# Patient Record
Sex: Female | Born: 1980 | Race: Black or African American | Hispanic: No | Marital: Single | Smoking: Current every day smoker
Health system: Southern US, Community
[De-identification: ages and names within clinical notes are randomized; demographics above are authoritative.]

## PROBLEM LIST (undated history)

## (undated) DIAGNOSIS — J029 Acute pharyngitis, unspecified: Secondary | ICD-10-CM

## (undated) DIAGNOSIS — R51 Headache: Secondary | ICD-10-CM

## (undated) DIAGNOSIS — R002 Palpitations: Secondary | ICD-10-CM

## (undated) DIAGNOSIS — Z789 Other specified health status: Secondary | ICD-10-CM

## (undated) DIAGNOSIS — R1310 Dysphagia, unspecified: Secondary | ICD-10-CM

## (undated) DIAGNOSIS — R6883 Chills (without fever): Secondary | ICD-10-CM

## (undated) DIAGNOSIS — Z0282 Encounter for adoption services: Secondary | ICD-10-CM

## (undated) DIAGNOSIS — E079 Disorder of thyroid, unspecified: Secondary | ICD-10-CM

## (undated) DIAGNOSIS — E039 Hypothyroidism, unspecified: Secondary | ICD-10-CM

## (undated) DIAGNOSIS — R531 Weakness: Secondary | ICD-10-CM

## (undated) HISTORY — DX: Weakness: R53.1

## (undated) HISTORY — DX: Acute pharyngitis, unspecified: J02.9

## (undated) HISTORY — DX: Dysphagia, unspecified: R13.10

## (undated) HISTORY — DX: Headache: R51

## (undated) HISTORY — DX: Disorder of thyroid, unspecified: E07.9

## (undated) HISTORY — DX: Chills (without fever): R68.83

---

## 2004-08-22 ENCOUNTER — Other Ambulatory Visit: Admission: RE | Admit: 2004-08-22 | Discharge: 2004-08-22 | Payer: Self-pay | Admitting: Internal Medicine

## 2004-08-22 ENCOUNTER — Ambulatory Visit: Payer: Self-pay | Admitting: Internal Medicine

## 2004-08-31 ENCOUNTER — Ambulatory Visit: Payer: Self-pay | Admitting: Internal Medicine

## 2004-09-02 ENCOUNTER — Ambulatory Visit: Payer: Self-pay | Admitting: Internal Medicine

## 2005-04-04 ENCOUNTER — Ambulatory Visit: Payer: Self-pay | Admitting: Internal Medicine

## 2005-04-19 ENCOUNTER — Ambulatory Visit: Payer: Self-pay | Admitting: Family Medicine

## 2006-04-25 ENCOUNTER — Emergency Department (HOSPITAL_COMMUNITY): Admission: EM | Admit: 2006-04-25 | Discharge: 2006-04-25 | Payer: Self-pay | Admitting: Family Medicine

## 2006-05-19 ENCOUNTER — Inpatient Hospital Stay (HOSPITAL_COMMUNITY): Admission: AD | Admit: 2006-05-19 | Discharge: 2006-05-19 | Payer: Self-pay | Admitting: Gynecology

## 2007-06-10 ENCOUNTER — Inpatient Hospital Stay (HOSPITAL_COMMUNITY): Admission: AD | Admit: 2007-06-10 | Discharge: 2007-06-11 | Payer: Self-pay | Admitting: Obstetrics & Gynecology

## 2007-06-10 ENCOUNTER — Ambulatory Visit: Payer: Self-pay | Admitting: Obstetrics and Gynecology

## 2007-10-18 ENCOUNTER — Encounter: Payer: Self-pay | Admitting: Internal Medicine

## 2007-10-30 ENCOUNTER — Ambulatory Visit: Payer: Self-pay | Admitting: Internal Medicine

## 2007-10-30 DIAGNOSIS — M549 Dorsalgia, unspecified: Secondary | ICD-10-CM | POA: Insufficient documentation

## 2007-10-30 DIAGNOSIS — E039 Hypothyroidism, unspecified: Secondary | ICD-10-CM | POA: Insufficient documentation

## 2007-11-19 ENCOUNTER — Encounter: Payer: Self-pay | Admitting: Internal Medicine

## 2007-12-05 ENCOUNTER — Telehealth: Payer: Self-pay | Admitting: *Deleted

## 2008-01-03 ENCOUNTER — Ambulatory Visit: Payer: Self-pay | Admitting: Internal Medicine

## 2008-01-08 LAB — CONVERTED CEMR LAB: TSH: 9.73 microintl units/mL — ABNORMAL HIGH (ref 0.35–5.50)

## 2008-01-13 ENCOUNTER — Ambulatory Visit: Payer: Self-pay | Admitting: Internal Medicine

## 2008-01-13 DIAGNOSIS — H60399 Other infective otitis externa, unspecified ear: Secondary | ICD-10-CM | POA: Insufficient documentation

## 2008-01-13 DIAGNOSIS — R5383 Other fatigue: Secondary | ICD-10-CM

## 2008-01-13 DIAGNOSIS — R5381 Other malaise: Secondary | ICD-10-CM | POA: Insufficient documentation

## 2008-01-13 LAB — CONVERTED CEMR LAB: Glucose, Bld: 109 mg/dL

## 2008-02-18 ENCOUNTER — Encounter: Payer: Self-pay | Admitting: Internal Medicine

## 2009-08-16 ENCOUNTER — Inpatient Hospital Stay (HOSPITAL_COMMUNITY): Admission: AD | Admit: 2009-08-16 | Discharge: 2009-08-16 | Payer: Self-pay | Admitting: Obstetrics and Gynecology

## 2009-08-18 ENCOUNTER — Inpatient Hospital Stay (HOSPITAL_COMMUNITY): Admission: AD | Admit: 2009-08-18 | Discharge: 2009-08-18 | Payer: Self-pay | Admitting: Obstetrics & Gynecology

## 2009-09-15 ENCOUNTER — Emergency Department (HOSPITAL_COMMUNITY): Admission: EM | Admit: 2009-09-15 | Discharge: 2009-09-15 | Payer: Self-pay | Admitting: Emergency Medicine

## 2010-03-16 ENCOUNTER — Emergency Department (HOSPITAL_COMMUNITY): Admission: EM | Admit: 2010-03-16 | Discharge: 2010-03-16 | Payer: Self-pay | Admitting: Emergency Medicine

## 2010-03-23 ENCOUNTER — Emergency Department (HOSPITAL_COMMUNITY): Admission: EM | Admit: 2010-03-23 | Discharge: 2010-03-23 | Payer: Self-pay | Admitting: Family Medicine

## 2010-10-07 LAB — CBC
HCT: 39.8 % (ref 36.0–46.0)
MCHC: 32.4 g/dL (ref 30.0–36.0)
MCV: 87.1 fL (ref 78.0–100.0)
RDW: 13.2 % (ref 11.5–15.5)

## 2010-10-07 LAB — POCT PREGNANCY, URINE: Preg Test, Ur: NEGATIVE

## 2010-10-07 LAB — POCT I-STAT, CHEM 8
BUN: 7 mg/dL (ref 6–23)
Calcium, Ion: 1.18 mmol/L (ref 1.12–1.32)
Glucose, Bld: 90 mg/dL (ref 70–99)
TCO2: 25 mmol/L (ref 0–100)

## 2010-10-07 LAB — URINALYSIS, ROUTINE W REFLEX MICROSCOPIC
Leukocytes, UA: NEGATIVE
Protein, ur: NEGATIVE mg/dL
Urobilinogen, UA: 2 mg/dL — ABNORMAL HIGH (ref 0.0–1.0)

## 2010-10-07 LAB — URINE MICROSCOPIC-ADD ON

## 2010-10-07 LAB — DIFFERENTIAL
Basophils Absolute: 0 10*3/uL (ref 0.0–0.1)
Basophils Relative: 0 % (ref 0–1)
Eosinophils Relative: 3 % (ref 0–5)
Lymphocytes Relative: 34 % (ref 12–46)
Monocytes Absolute: 0.4 10*3/uL (ref 0.1–1.0)

## 2010-10-09 LAB — URINALYSIS, ROUTINE W REFLEX MICROSCOPIC
Bilirubin Urine: NEGATIVE
Ketones, ur: NEGATIVE mg/dL
Nitrite: NEGATIVE
pH: 5 (ref 5.0–8.0)

## 2010-10-09 LAB — WET PREP, GENITAL
Trich, Wet Prep: NONE SEEN
Yeast Wet Prep HPF POC: NONE SEEN

## 2010-10-09 LAB — GC/CHLAMYDIA PROBE AMP, GENITAL
Chlamydia, DNA Probe: NEGATIVE
GC Probe Amp, Genital: NEGATIVE

## 2010-10-09 LAB — HCG, QUANTITATIVE, PREGNANCY: hCG, Beta Chain, Quant, S: 59 m[IU]/mL — ABNORMAL HIGH (ref <5)

## 2010-10-09 LAB — CBC
HCT: 37.1 % (ref 36.0–46.0)
MCHC: 33.2 g/dL (ref 30.0–36.0)
MCV: 88.6 fL (ref 78.0–100.0)
Platelets: 283 10*3/uL (ref 150–400)
RDW: 13.8 % (ref 11.5–15.5)

## 2010-12-12 ENCOUNTER — Emergency Department (HOSPITAL_COMMUNITY)
Admission: EM | Admit: 2010-12-12 | Discharge: 2010-12-12 | Discharge status: Against Medical Advice | Payer: Medicaid Other | Attending: Emergency Medicine | Admitting: Emergency Medicine

## 2010-12-12 DIAGNOSIS — R112 Nausea with vomiting, unspecified: Secondary | ICD-10-CM | POA: Insufficient documentation

## 2010-12-12 LAB — DIFFERENTIAL
Lymphocytes Relative: 29 % (ref 12–46)
Monocytes Absolute: 0.4 10*3/uL (ref 0.1–1.0)
Monocytes Relative: 6 % (ref 3–12)
Neutro Abs: 3.7 10*3/uL (ref 1.7–7.7)

## 2010-12-12 LAB — URINALYSIS, ROUTINE W REFLEX MICROSCOPIC
Bilirubin Urine: NEGATIVE
Glucose, UA: NEGATIVE mg/dL
Ketones, ur: 15 mg/dL — AB
Specific Gravity, Urine: 1.018 (ref 1.005–1.030)
pH: 5.5 (ref 5.0–8.0)

## 2010-12-12 LAB — CBC
HCT: 37.9 % (ref 36.0–46.0)
Hemoglobin: 12.6 g/dL (ref 12.0–15.0)
MCH: 27.8 pg (ref 26.0–34.0)
MCHC: 33.2 g/dL (ref 30.0–36.0)
MCV: 83.5 fL (ref 78.0–100.0)

## 2010-12-12 LAB — POCT I-STAT, CHEM 8
BUN: 8 mg/dL (ref 6–23)
Calcium, Ion: 1.22 mmol/L (ref 1.12–1.32)
Chloride: 104 mEq/L (ref 96–112)
Glucose, Bld: 102 mg/dL — ABNORMAL HIGH (ref 70–99)
HCT: 41 % (ref 36.0–46.0)
Potassium: 4 mEq/L (ref 3.5–5.1)

## 2011-01-04 ENCOUNTER — Inpatient Hospital Stay (HOSPITAL_COMMUNITY)
Admission: AD | Admit: 2011-01-04 | Discharge: 2011-01-04 | Disposition: A | Discharge status: Home / Self Care | Payer: Medicaid Other | Source: Ambulatory Visit | Attending: Family Medicine | Admitting: Family Medicine

## 2011-01-04 DIAGNOSIS — N949 Unspecified condition associated with female genital organs and menstrual cycle: Secondary | ICD-10-CM | POA: Insufficient documentation

## 2011-01-04 DIAGNOSIS — N938 Other specified abnormal uterine and vaginal bleeding: Secondary | ICD-10-CM | POA: Insufficient documentation

## 2011-01-04 LAB — URINE MICROSCOPIC-ADD ON

## 2011-01-04 LAB — URINALYSIS, ROUTINE W REFLEX MICROSCOPIC
Glucose, UA: NEGATIVE mg/dL
Specific Gravity, Urine: 1.015 (ref 1.005–1.030)
Urobilinogen, UA: 1 mg/dL (ref 0.0–1.0)

## 2011-01-04 LAB — CBC
MCH: 27.4 pg (ref 26.0–34.0)
MCV: 86.4 fL (ref 78.0–100.0)
Platelets: 206 10*3/uL (ref 150–400)
RDW: 13.4 % (ref 11.5–15.5)

## 2011-01-04 LAB — WET PREP, GENITAL: Clue Cells Wet Prep HPF POC: NONE SEEN

## 2011-05-02 LAB — URINALYSIS, ROUTINE W REFLEX MICROSCOPIC
Bilirubin Urine: NEGATIVE
Glucose, UA: NEGATIVE
Ketones, ur: NEGATIVE
Protein, ur: NEGATIVE

## 2011-05-09 ENCOUNTER — Other Ambulatory Visit: Payer: Self-pay | Admitting: Internal Medicine

## 2011-05-09 DIAGNOSIS — E039 Hypothyroidism, unspecified: Secondary | ICD-10-CM

## 2011-05-11 ENCOUNTER — Ambulatory Visit
Admission: RE | Admit: 2011-05-11 | Discharge: 2011-05-11 | Disposition: A | Discharge status: Home / Self Care | Payer: Medicaid Other | Source: Ambulatory Visit | Attending: Internal Medicine | Admitting: Internal Medicine

## 2011-05-11 DIAGNOSIS — E039 Hypothyroidism, unspecified: Secondary | ICD-10-CM

## 2011-07-25 DIAGNOSIS — E041 Nontoxic single thyroid nodule: Secondary | ICD-10-CM

## 2011-07-25 HISTORY — DX: Nontoxic single thyroid nodule: E04.1

## 2011-07-26 ENCOUNTER — Encounter (INDEPENDENT_AMBULATORY_CARE_PROVIDER_SITE_OTHER): Payer: Self-pay | Admitting: General Surgery

## 2011-07-27 ENCOUNTER — Encounter (INDEPENDENT_AMBULATORY_CARE_PROVIDER_SITE_OTHER): Payer: Self-pay | Admitting: Surgery

## 2011-07-27 ENCOUNTER — Ambulatory Visit (INDEPENDENT_AMBULATORY_CARE_PROVIDER_SITE_OTHER): Payer: Medicaid Other | Admitting: Surgery

## 2011-07-27 VITALS — BP 118/72 | HR 76 | Temp 98.1°F | Ht 66.0 in | Wt 171.6 lb

## 2011-07-27 DIAGNOSIS — E041 Nontoxic single thyroid nodule: Secondary | ICD-10-CM

## 2011-07-27 NOTE — Patient Instructions (Signed)
Thyroid Diseases Your thyroid is a butterfly-shaped gland in your neck. It is located just above your collarbone. It is one of your endocrine glands, which make hormones. The thyroid helps set your metabolism. Metabolism is how your body gets energy from the foods you eat.  Millions of people have thyroid diseases. Women experience thyroid problems more often than men. In fact, overactive thyroid problems (hyperthyroidism) occur in 1% of all women. If you have a thyroid disease, your body may use energy more slowly or quickly than it should.  Thyroid problems also include an immune disease where your body reacts against your thyroid gland (called thyroiditis). A different problem involves lumps and bumps (called nodules) that develop in the gland. The nodules are usually, but not always, noncancerous. THE MOST COMMON THYROID PROBLEMS AND CAUSES ARE DISCUSSED BELOW There are many causes for thyroid problems. Treatment depends upon the exact diagnosis and includes trying to reset your body's metabolism to a normal rate. Hyperthyroidism Too much thyroid hormone from an overactive thyroid gland is called hyperthyroidism. In hyperthyroidism, the body's metabolism speeds up. One of the most frequent forms of hyperthyroidism is known as Graves' disease. Graves' disease tends to run in families. Although Graves' is thought to be caused by a problem with the immune system, the exact nature of the genetic problem is unknown. Hypothyroidism Too little thyroid hormone from an underactive thyroid gland is called hypothyroidism. In hypothyroidism, the body's metabolism is slowed. Several things can cause this condition. Most causes affect the thyroid gland directly and hurt its ability to make enough hormone.  Rarely, there may be a pituitary gland tumor (located near the base of the brain). The tumor can block the pituitary from producing thyroid-stimulating hormone (TSH). Your body makes TSH to stimulate the thyroid  to work properly. If the pituitary does not make enough TSH, the thyroid fails to make enough hormones needed for good health. Whether the problem is caused by thyroid conditions or by the pituitary gland, the result is that the thyroid is not making enough hormones. Hypothyroidism causes many physical and mental processes to become sluggish. The body consumes less oxygen and produces less body heat. Thyroid Nodules A thyroid nodule is a small swelling or lump in the thyroid gland. They are common. These nodules represent either a growth of thyroid tissue or a fluid-filled cyst. Both form a lump in the thyroid gland. Almost half of all people will have tiny thyroid nodules at some point in their lives. Typically, these are not noticeable until they become large and affect normal thyroid size. Larger nodules that are greater than a half inch across (about 1 centimeter) occur in about 5 percent of people. Although most nodules are not cancerous, people who have them should seek medical care to rule out cancer. Also, some thyroid nodules may produce too much thyroid hormone or become too large. Large nodules or a large gland can interfere with breathing or swallowing or may cause neck discomfort. Other problems Other thyroid problems include cancer and thyroiditis. Thyroiditis is a malfunction of the body's immune system. Normally, the immune system works to defend the body against infection and other problems. When the immune system is not working properly, it may mistakenly attack normal cells, tissues, and organs. Examples of autoimmune diseases are Hashimoto's thyroiditis (which causes low thyroid function) and Graves' disease (which causes excess thyroid function). SYMPTOMS  Symptoms vary greatly depending upon the exact type of problem with the thyroid. Hyperthyroidism-is when your thyroid is too   active and makes more thyroid hormone than your body needs. The most common cause is Graves' Disease. Too  much thyroid hormone can cause some or all of the following symptoms:  Anxiety.   Irritability.   Difficulty sleeping.   Fatigue.   A rapid or irregular heartbeat.   A fine tremor of your hands or fingers.   An increase in perspiration.   Sensitivity to heat.   Weight loss, despite normal food intake.   Brittle hair.   Enlargement of your thyroid gland (goiter).   Light menstrual periods.   Frequent bowel movements.  Graves' disease can specifically cause eye and skin problems. The skin problems involve reddening and swelling of the skin, often on your shins and on the top of your feet. Eye problems can include the following:  Excess tearing and sensation of grit or sand in either or both eyes.   Reddened or inflamed eyes.   Widening of the space between your eyelids.   Swelling of the lids and tissues around the eyes.   Light sensitivity.   Ulcers on the cornea.   Double vision.   Limited eye movements.   Blurred or reduced vision.  Hypothyroidism- is when your thyroid gland is not active enough. This is more common than hyperthyroidism. Symptoms can vary a lot depending of the severity of the hormone deficiency. Symptoms may develop over a long period of time and can include several of the following:  Fatigue.   Sluggishness.   Increased sensitivity to cold.   Constipation.   Pale, dry skin.   A puffy face.   Hoarse voice.   High blood cholesterol level.   Unexplained weight gain.   Muscle aches, tenderness and stiffness.   Pain, stiffness or swelling in your joints.   Muscle weakness.   Heavier than normal menstrual periods.   Brittle fingernails and hair.   Depression.  Thyroid Nodules - most do not cause signs or symptoms. Occasionally, some may become so large that you can feel or even see the swelling at the base of your neck. You may realize a lump or swelling is there when you are shaving or putting on makeup. Men might become  aware of a nodule when shirt collars suddenly feel too tight. Some nodules produce too much thyroid hormone. This can produce the same symptoms as hyperthyroidism (see above). Thyroid nodules are seldom cancerous. However, a nodule is more likely to be malignant (cancerous) if it:  Grows quickly or feels hard.   Causes you to become hoarse or to have trouble swallowing or breathing.   Causes enlarged lymph nodes under your jaw or in your neck.  DIAGNOSIS  Because there are so many possible thyroid conditions, your caregiver may ask for a number of tests. They will do this in order to narrow down the exact diagnosis. These tests can include:  Blood and antibody tests.   Special thyroid scans using small, safe amounts of radioactive iodine.   Ultrasound of the thyroid gland (particularly if there is a nodule or lump).   Biopsy. This is usually done with a special needle. A needle biopsy is a procedure to obtain a sample of cells from the thyroid. The tissue will be tested in a lab and examined under a microscope.  TREATMENT  Treatment depends on the exact diagnosis. Hyperthyroidism  Beta-blockers help relieve many of the symptoms.   Anti-thyroid medications prevent the thyroid from making excess hormones.   Radioactive iodine treatment can destroy overactive thyroid   cells. The iodine can permanently decrease the amount of hormone produced.   Surgery to remove the thyroid gland.   Treatments for eye problems that come from Graves' disease also include medications and special eye surgery, if felt to be appropriate.  Hypothyroidism Thyroid replacement with levothyroxine is the mainstay of treatment. Treatment with thyroid replacement is usually lifelong and will require monitoring and adjustment from time to time. Thyroid Nodules  Watchful waiting. If a small nodule causes no symptoms or signs of cancer on biopsy, then no treatment may be chosen at first. Re-exam and re-checking blood  tests would be the recommended follow-up.   Anti-thyroid medications or radioactive iodine treatment may be recommended if the nodules produce too much thyroid hormone (see Treatment for Hyperthyroidism above).   Alcohol ablation. Injections of small amounts of ethyl alcohol (ethanol) can cause a non-cancerous nodule to shrink in size.   Surgery (see Treatment for Hyperthyroidism above).  HOME CARE INSTRUCTIONS   Take medications as instructed.   Follow through on recommended testing.  SEEK MEDICAL CARE IF:   You feel that you are developing symptoms of Hyperthyroidism or Hypothyroidism as described above.   You develop a new lump/nodule in the neck/thyroid area that you had not noticed before.   You feel that you are having side effects from medicines prescribed.   You develop trouble breathing or swallowing.  SEEK IMMEDIATE MEDICAL CARE IF:   You develop a fever of 102 F (38.9 C) or higher.   You develop severe sweating.   You develop palpitations and/or rapid heart beat.   You develop shortness of breath.   You develop nausea and vomiting.   You develop extreme shakiness.   You develop agitation.   You develop lightheadedness or have a fainting episode.  Document Released: 05/07/2007 Document Revised: 03/22/2011 Document Reviewed: 05/07/2007 [employer] Patient Information 2012 [employer]. 

## 2011-07-27 NOTE — Progress Notes (Signed)
Patient ID: Briana Harrell, female   DOB: July 05, 1981, 31 y.o.   MRN: [id number]  Chief Complaint  Patient presents with  . Pre-op Exam    eval thy nodule    HPI Briana Harrell is a 31 y.o. female.  The patient presents at the request of Dr. Roderic Palau for a thyroid nodule and dysphasia. She complains of some difficulty swallowing and her throat and some soreness along the left side of her neck over her thyroid. She has been hypothyroid for 10 years and takes Synthroid. She complains of some tenderness in her left neck. She denies any hoarseness and stopped smoking 2 years ago.HPI  Past Medical History  Diagnosis Date  . Thyroid disease   . Chills   . Sore throat   . Trouble swallowing   . Constipation   . Headache   . Weakness     History reviewed. No pertinent past surgical history.  History reviewed. No pertinent family history.  Social History History  Substance Use Topics  . Smoking status: Former Smoker    Quit date: 12/24/2009  . Smokeless tobacco: Not on file  . Alcohol Use: Yes     social    No Known Allergies  Current Outpatient Prescriptions  Medication Sig Dispense Refill  . ALPRAZolam (XANAX) 0.5 MG tablet Take 0.5 mg by mouth at bedtime as needed.        Marland Kitchen levothyroxine (SYNTHROID, LEVOTHROID) 200 MCG tablet Take 200 mcg by mouth daily.          Review of Systems Review of Systems  Constitutional: Positive for fatigue and unexpected weight change.  HENT: Positive for neck pain. Negative for neck stiffness.   Eyes: Negative.   Respiratory: Positive for choking.   Cardiovascular: Negative.   Gastrointestinal: Negative.   Genitourinary: Negative.   Neurological: Negative.   Hematological: Negative.   Psychiatric/Behavioral: Negative.     Blood pressure 118/72, pulse 76, temperature 98.1 F (36.7 C), temperature source Temporal, height 5\' 6"  (1.676 m), weight 171 lb 9.6 oz (77.837 kg), SpO2 98.00%.  Physical Exam Physical Exam  Constitutional: She  is oriented to person, place, and time. She appears well-developed and well-nourished.  HENT:  Head: Normocephalic and atraumatic.  Eyes: EOM are normal. Pupils are equal, round, and reactive to light.  Neck: Normal range of motion. Neck supple. No tracheal deviation present. No thyromegaly present.  Cardiovascular: Normal rate and regular rhythm.   Pulmonary/Chest: Effort normal and breath sounds normal. No stridor.  Musculoskeletal: Normal range of motion.  Lymphadenopathy:    She has no cervical adenopathy.  Neurological: She is alert and oriented to person, place, and time.  Skin: Skin is warm and dry.  Psychiatric: She has a normal mood and affect. Her behavior is normal. Judgment and thought content normal.    Data Reviewed Thyroid U/S ATROPHIC GLAND.  5 MM NODULE LEFT LOBE. tsh 0.040  T4 free  1.71  Assessment    5 MM LEFT THYROID LOBE NODULE HYPOTHYROIDISM    Plan    Too small to biopsy.  Recommend 6 month follow up.        Naomi Fitton A. 07/27/2011, 11:44 AM

## 2012-01-23 ENCOUNTER — Ambulatory Visit (INDEPENDENT_AMBULATORY_CARE_PROVIDER_SITE_OTHER): Payer: Medicaid Other | Admitting: Surgery

## 2012-01-30 ENCOUNTER — Encounter (INDEPENDENT_AMBULATORY_CARE_PROVIDER_SITE_OTHER): Payer: Self-pay | Admitting: Surgery

## 2013-05-13 IMAGING — US US SOFT TISSUE HEAD/NECK
1 series · 14 of 25 positions shown · non-contrast
Comparison: None.

CLINICAL DATA: Hypothyroidism difficulty swallowing.

ULTRASOUND OF HEAD/NECK SOFT TISSUES
TECHNIQUE: Ultrasound examination of the head and neck soft
tissues was performed in the area of clinical concern.

[Series 1: us soft tissue head/neck · 0.08mm/px · 14 of 31 slices shown]
[im 1/31]
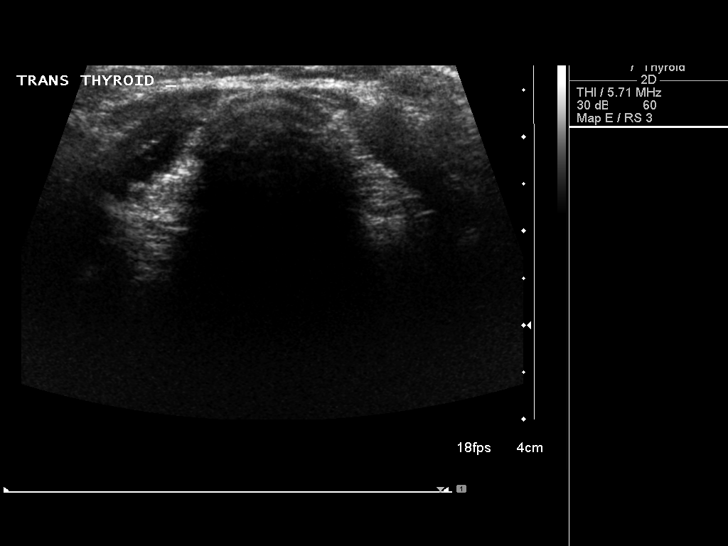
[im 3/31]
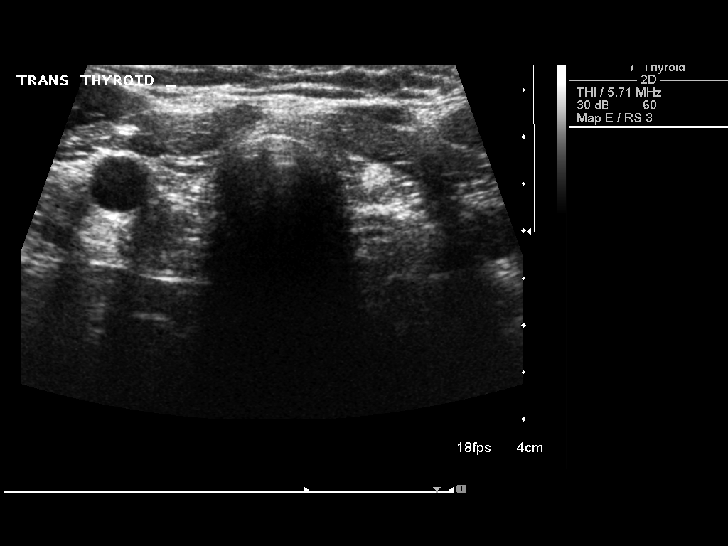
[im 6/31]
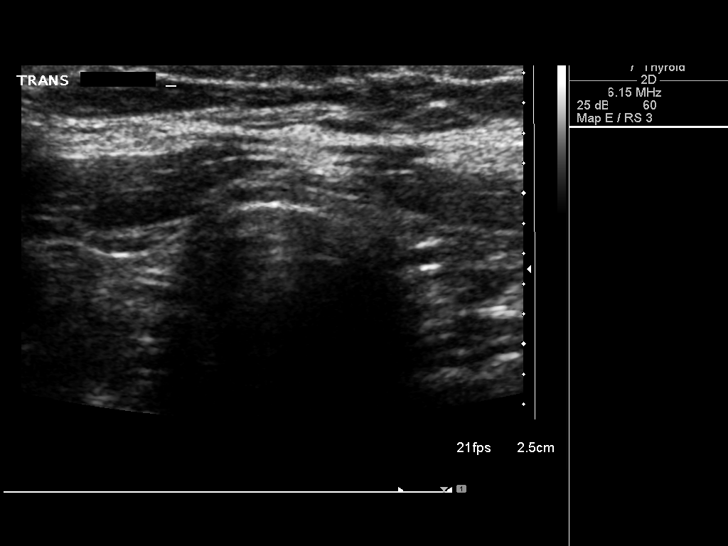
[im 8/31]
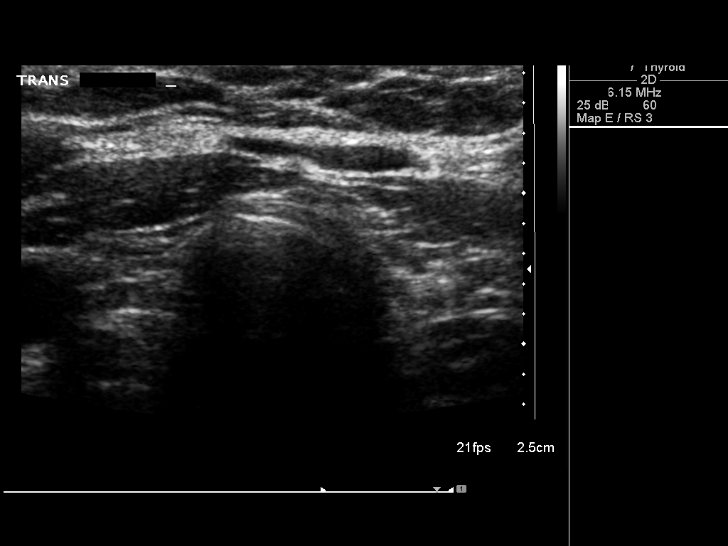
[im 11/31]
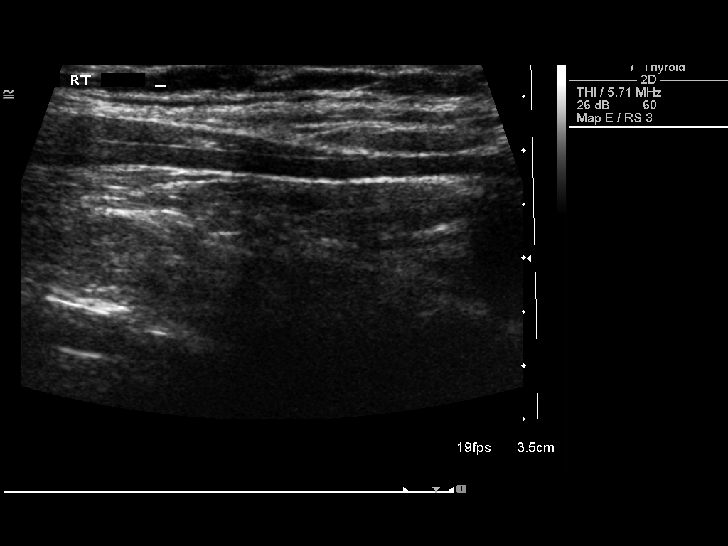
[im 12/31]
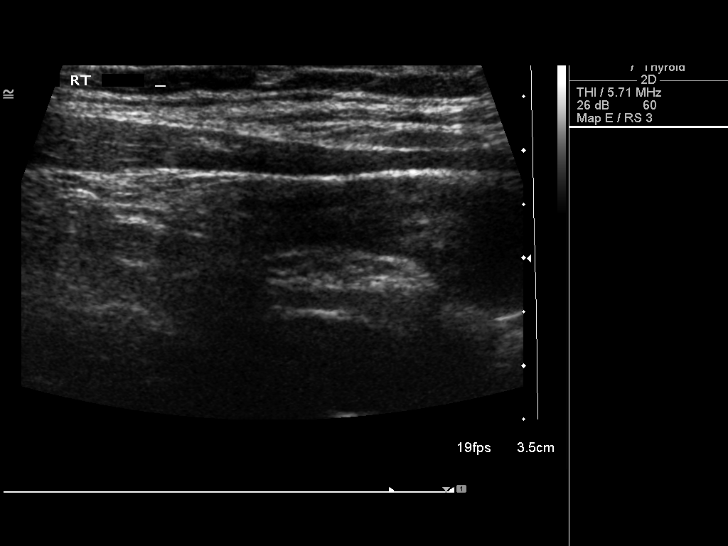
[im 14/31]
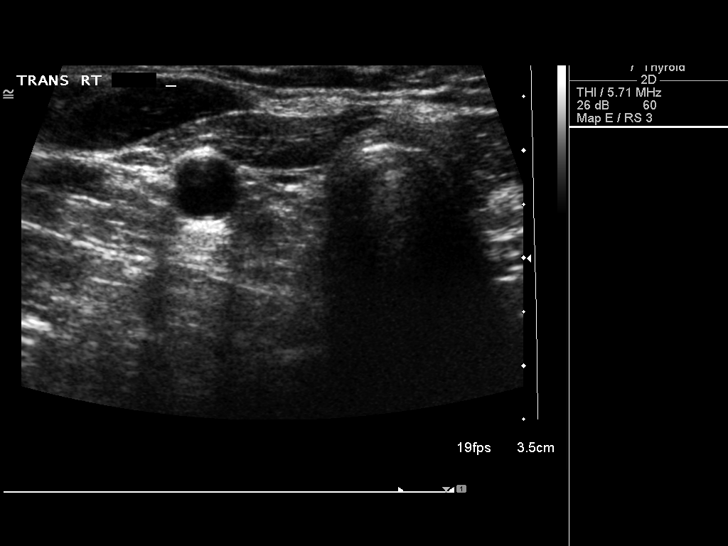
[im 17/31]
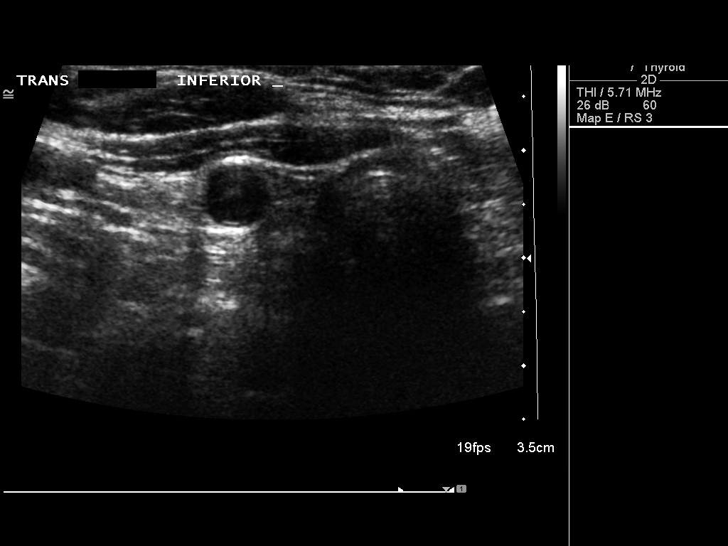
[im 19/31]
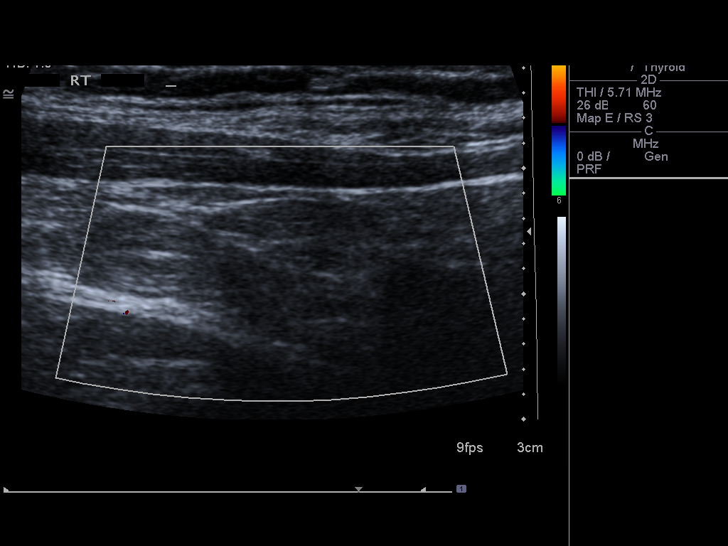
[im 21/31]
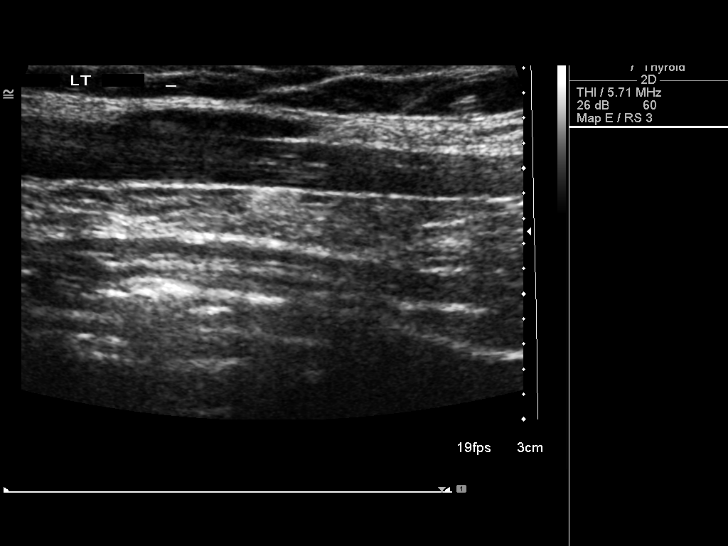
[im 23/31]
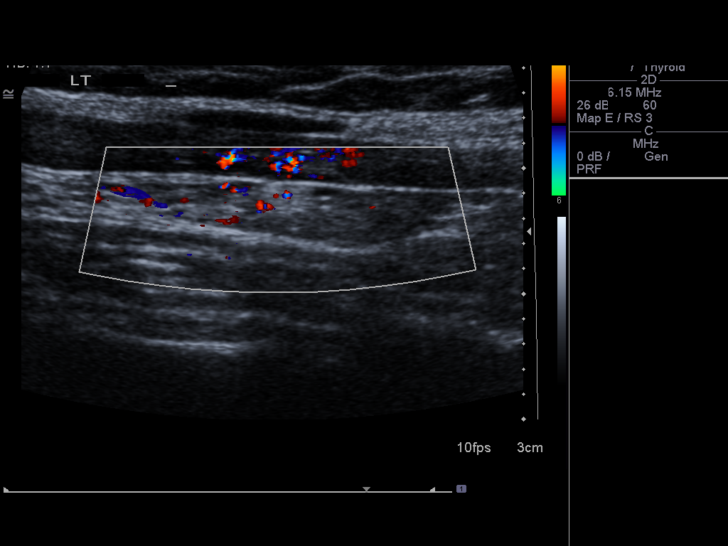
[im 26/31]
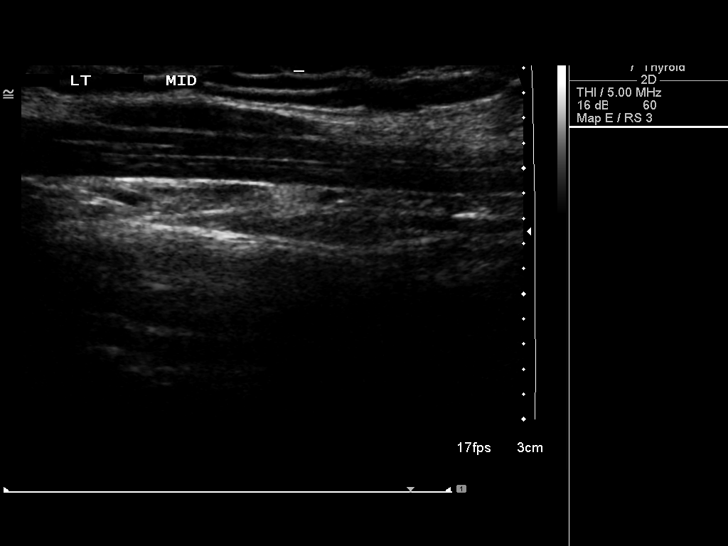
[im 28/31]
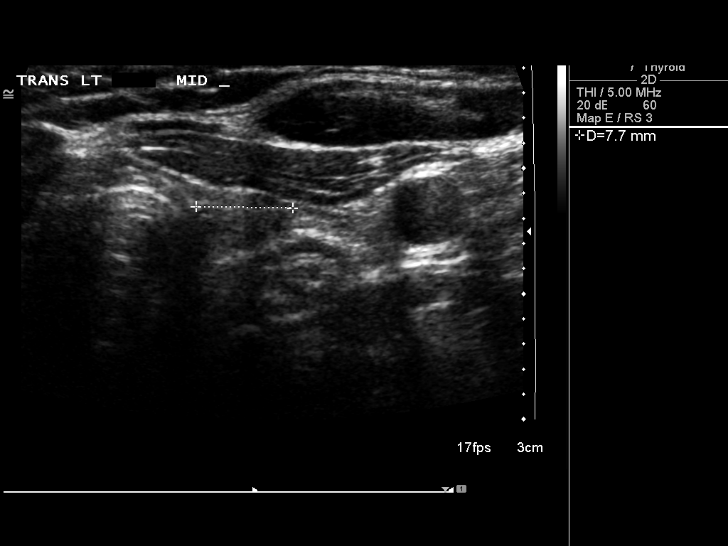
[im 31/31]
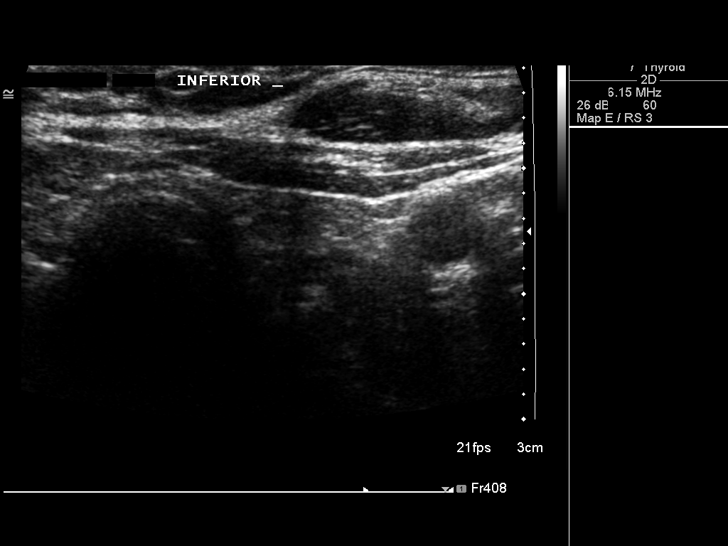

[14 of 25 positions shown; findings below may reference images not displayed]

FINDINGS: The right lobe measures 2.7 x 0.7 x 0.9 cm.

The left lobe measures 2.9 x 0.5 x 0.8 cm.

The isthmus measures 0.1 cm.

The thyroid echotexture is diffusely hypoechoic and heterogeneous
in appearance.

There is a solid echogenic nodule within the mid pole of the left
lobe which measures 5 x 3 x 4 mm. No microcalcifications
identified.  No cysts identified.
IMPRESSION: 1.  Hypoechoic and atrophic appearing thyroid gland.
2.  Solitary nodule in the left lobe of the thyroid gland measures
5 x 3 x 4 mm. There are no specific features of this nodule which
would indicate the necessity for biopsy.

## 2015-04-29 ENCOUNTER — Emergency Department (HOSPITAL_COMMUNITY)
Admission: EM | Admit: 2015-04-29 | Discharge: 2015-04-29 | Disposition: A | Discharge status: Other Acute Inpatient Hospital | Payer: Medicaid Other | Attending: Emergency Medicine | Admitting: Emergency Medicine

## 2015-04-29 ENCOUNTER — Encounter (HOSPITAL_COMMUNITY): Payer: Self-pay | Admitting: Emergency Medicine

## 2015-04-29 DIAGNOSIS — Z046 Encounter for general psychiatric examination, requested by authority: Secondary | ICD-10-CM | POA: Diagnosis present

## 2015-04-29 DIAGNOSIS — E079 Disorder of thyroid, unspecified: Secondary | ICD-10-CM | POA: Insufficient documentation

## 2015-04-29 DIAGNOSIS — Z87891 Personal history of nicotine dependence: Secondary | ICD-10-CM | POA: Insufficient documentation

## 2015-04-29 DIAGNOSIS — Z8719 Personal history of other diseases of the digestive system: Secondary | ICD-10-CM | POA: Insufficient documentation

## 2015-04-29 DIAGNOSIS — F1210 Cannabis abuse, uncomplicated: Secondary | ICD-10-CM | POA: Diagnosis not present

## 2015-04-29 DIAGNOSIS — Z79899 Other long term (current) drug therapy: Secondary | ICD-10-CM | POA: Diagnosis not present

## 2015-04-29 DIAGNOSIS — F1410 Cocaine abuse, uncomplicated: Secondary | ICD-10-CM | POA: Diagnosis not present

## 2015-04-29 DIAGNOSIS — F1510 Other stimulant abuse, uncomplicated: Secondary | ICD-10-CM | POA: Diagnosis not present

## 2015-04-29 DIAGNOSIS — Z008 Encounter for other general examination: Secondary | ICD-10-CM

## 2015-04-29 DIAGNOSIS — Z8709 Personal history of other diseases of the respiratory system: Secondary | ICD-10-CM | POA: Insufficient documentation

## 2015-04-29 LAB — CBC
HCT: 45.1 % (ref 36.0–46.0)
Hemoglobin: 15.1 g/dL — ABNORMAL HIGH (ref 12.0–15.0)
MCH: 29.3 pg (ref 26.0–34.0)
MCHC: 33.5 g/dL (ref 30.0–36.0)
MCV: 87.4 fL (ref 78.0–100.0)
PLATELETS: 416 10*3/uL — AB (ref 150–400)
RBC: 5.16 MIL/uL — ABNORMAL HIGH (ref 3.87–5.11)
RDW: 14.4 % (ref 11.5–15.5)
WBC: 14.5 10*3/uL — ABNORMAL HIGH (ref 4.0–10.5)

## 2015-04-29 LAB — BASIC METABOLIC PANEL
Anion gap: 13 (ref 5–15)
BUN: 7 mg/dL (ref 6–20)
CHLORIDE: 100 mmol/L — AB (ref 101–111)
CO2: 24 mmol/L (ref 22–32)
CREATININE: 0.81 mg/dL (ref 0.44–1.00)
Calcium: 9.6 mg/dL (ref 8.9–10.3)
GFR calc Af Amer: >60 mL/min (ref >60)
GLUCOSE: 111 mg/dL — AB (ref 65–99)
Potassium: 3.2 mmol/L — ABNORMAL LOW (ref 3.5–5.1)
SODIUM: 137 mmol/L (ref 135–145)

## 2015-04-29 LAB — ETHANOL

## 2015-04-29 LAB — I-STAT BETA HCG BLOOD, ED (MC, WL, AP ONLY): I-stat hCG, quantitative: <5 m[IU]/mL (ref <5)

## 2015-04-29 MED ORDER — POTASSIUM CHLORIDE CRYS ER 20 MEQ PO TBCR
20.0000 meq | EXTENDED_RELEASE_TABLET | Freq: Two times a day (BID) | ORAL | Status: DC
  Administered 2015-04-29: 20 meq via ORAL
  Filled 2015-04-29: qty 1

## 2015-04-29 NOTE — ED Provider Notes (Signed)
CSN: [id number]     Arrival date & time 04/29/15  8657 History   First MD Initiated Contact with Patient 04/29/15 [phone]     Chief Complaint  Patient presents with  . Medical Clearance     (Consider location/radiation/quality/duration/timing/severity/associated sxs/prior Treatment) HPI Comments: 34 y.o. Female presents with police from [facility] for medical clearance.  The patient was there for delusions and is IVC'd the patient reportedly tested positive for cocaine, amphetamines, and marijuana and so was sent here for medical clearance.  The patient is combative at the time of evaluation and told me that she was found in the trunk of a car.  She initially was not cooperative and was coughing and gagging trying to make herself throw up.    Past Medical History  Diagnosis Date  . Thyroid disease   . Chills   . Sore throat   . Trouble swallowing   . Constipation   . Headache(784.0)   . Weakness    History reviewed. No pertinent past surgical history. No family history on file. Social History  Substance Use Topics  . Smoking status: Former Smoker    Quit date: 12/24/2009  . Smokeless tobacco: None  . Alcohol Use: Yes     Comment: social   OB History    No data available     Review of Systems  Unable to perform ROS: Psychiatric disorder      Allergies  Review of patient's allergies indicates no known allergies.  Home Medications   Prior to Admission medications   Medication Sig Start Date End Date Taking? Authorizing Provider  ALPRAZolam Prudy Feeler) 0.5 MG tablet Take 0.5 mg by mouth at bedtime as needed.      Historical Provider, MD  levothyroxine (SYNTHROID, LEVOTHROID) 200 MCG tablet Take 200 mcg by mouth daily.      Historical Provider, MD   BP 144/90 mmHg  Pulse 105  Temp(Src) 99.1 F (37.3 C) (Oral)  Resp 20  SpO2 99%  LMP  (LMP Unknown) Physical Exam  Constitutional: She is oriented to person, place, and time. She appears well-developed and well-nourished. No  distress.  HENT:  Head: Normocephalic and atraumatic.  Right Ear: External ear normal.  Left Ear: External ear normal.  Nose: Nose normal.  Mouth/Throat: Oropharynx is clear and moist. No oropharyngeal exudate, posterior oropharyngeal edema or posterior oropharyngeal erythema.  Eyes: EOM are normal. Pupils are equal, round, and reactive to light.  Neck: Normal range of motion. Neck supple.  Cardiovascular: Normal rate, regular rhythm, normal heart sounds and intact distal pulses.   No murmur heard. Pulmonary/Chest: Effort normal. No respiratory distress. She has no wheezes. She has no rales.  Abdominal: Soft. She exhibits no distension. There is no tenderness.  Musculoskeletal: Normal range of motion. She exhibits no edema or tenderness.  Neurological: She is alert and oriented to person, place, and time.  Skin: Skin is warm and dry. No rash noted. She is not diaphoretic.  Psychiatric: Her affect is angry and inappropriate. Her speech is rapid and/or pressured and tangential. She is agitated, aggressive and hyperactive. Thought content is delusional. She expresses impulsivity and inappropriate judgment.  Vitals reviewed.   ED Course  Procedures (including critical care time) Labs Review Labs Reviewed - No data to display  Imaging Review No results found. I have personally reviewed and evaluated these images and lab results as part of my medical decision-making.   EKG Interpretation None      MDM  Patient was seen and evaluated  in stable condition.  Patient not cooperative, trying to make herself vomit.  Benign examination.  Afebrile.  Normal heart rate when cooperating/resting.  No sign of infection/trauma/injury on examination.  Laboratory studies unremarkable other than elevated WBC and platelet likely stress reaction.  No sign of infection.  PAtient discharged back to her psychiatric facility. Final diagnoses:  None    1. Behavioral disorder      Leta Baptist,  MD 04/30/15 2128

## 2015-04-29 NOTE — ED Notes (Signed)
UDS prefromed at [facility]

## 2015-04-29 NOTE — ED Notes (Signed)
Bed: ZO10 Expected date:  Expected time:  Means of arrival:  Comments: Med clearance from [facility]

## 2015-04-29 NOTE — ED Notes (Signed)
Per [place] paperwork, patient has been delusional and abusing drugs-here for medical clearance and will return to [facility]-patient is IVCd-patient is uncooperative with triage process-making herself throw up

## 2015-04-29 NOTE — Discharge Instructions (Signed)
Medical Clearance  Patient is medically clear for psychiatric treatment and evaluation.  Continue psychiatric treatment per psychiatry.

## 2018-02-18 ENCOUNTER — Other Ambulatory Visit: Payer: Self-pay | Admitting: Neurology

## 2018-02-18 DIAGNOSIS — R569 Unspecified convulsions: Secondary | ICD-10-CM

## 2018-02-20 ENCOUNTER — Ambulatory Visit (HOSPITAL_COMMUNITY)
Admission: RE | Admit: 2018-02-20 | Discharge: 2018-02-20 | Disposition: A | Discharge status: Home / Self Care | Payer: Medicaid Other | Source: Ambulatory Visit | Attending: Neurology | Admitting: Neurology

## 2018-02-20 DIAGNOSIS — R569 Unspecified convulsions: Secondary | ICD-10-CM

## 2018-02-20 NOTE — Procedures (Signed)
ELECTROENCEPHALOGRAM REPORT   Patient: Briana Harrell       Room #: OP EEG No. ID: [id number] Age: 37 y.o.        Sex: female Referring Physician: Ellan Lambertnasanya Report Date:  02/20/2018        Interpreting Physician: Thana FarrEYNOLDS, Dariyah Garduno  History: Briana Ghazilikii C Schaper is an 37 y.o. female with frequent seizure-like episodes  Medications:  Abilify, Neurontin, Ambien  Conditions of Recording:  This is a 21 channel routine scalp EEG performed with bipolar and monopolar montages arranged in accordance to the international 10/20 system of electrode placement. One channel was dedicated to EKG recording.  The patient is in the awake and drowsy states.  Description:  The waking background activity consists of a low voltage, symmetrical, fairly well organized, 10 Hz alpha activity, seen from the parieto-occipital and posterior temporal regions.  Low voltage fast activity, poorly organized, is seen anteriorly and is at times superimposed on more posterior regions.  A mixture of theta and alpha rhythms are seen from the central and temporal regions. The patient drowses with slowing to irregular, low voltage theta and beta activity.   Stage II sleep is not obtained. No epileptiform activity is noted.   Hyperventilation was performed and produced a mild buildup but failed to elicit any abnormalities.  Intermittent photic stimulation was performed but failed to illicit any change in the tracing.  IMPRESSION: Normal electroencephalogram, awake, drowsy and with activation procedures. There are no focal lateralizing or epileptiform features.   Thana FarrLeslie Dyamon Sosinski, MD Neurology [phone] 02/20/2018, 3:56 PM

## 2018-02-20 NOTE — Progress Notes (Signed)
EEG Completed; Results Pending  

## 2020-06-08 ENCOUNTER — Ambulatory Visit: Payer: Self-pay | Admitting: Surgery

## 2020-07-19 ENCOUNTER — Encounter (HOSPITAL_COMMUNITY): Payer: Self-pay | Admitting: Surgery

## 2020-07-19 ENCOUNTER — Other Ambulatory Visit: Payer: Self-pay

## 2020-07-19 NOTE — Progress Notes (Addendum)
COVID Vaccine Completed: No Date COVID Vaccine completed: N/A COVID vaccine manufacturer: N/A   PCP - Fleet Contras, MD Cardiologist - N/A  Chest x-ray - N/A EKG - N/A Stress Test - N/A ECHO - N/A Cardiac Cath - N/A Pacemaker/ICD device last checked:N/A  Sleep Study - N/A CPAP - N/A  Fasting Blood Sugar - N/A Checks Blood Sugar __N/A___ times a day  Blood Thinner Instructions: N/A Aspirin Instructions: N/A Last Dose: N/A  Activity level: Able to exercise without symptoms     Anesthesia review:  N/A  Patient denies shortness of breath, fever, cough and chest pain at PAT appointment   Patient verbalized understanding of instructions that were given to them at the PAT appointment. Patient was also instructed that they will need to review over the PAT instructions again at home before surgery.

## 2020-07-22 ENCOUNTER — Other Ambulatory Visit (HOSPITAL_COMMUNITY): Payer: Medicaid Other

## 2020-07-23 ENCOUNTER — Other Ambulatory Visit (HOSPITAL_COMMUNITY): Payer: Medicaid Other

## 2020-07-23 ENCOUNTER — Other Ambulatory Visit (HOSPITAL_COMMUNITY)
Admission: RE | Admit: 2020-07-23 | Discharge: 2020-07-23 | Disposition: A | Discharge status: Home / Self Care | Payer: Medicaid Other | Source: Ambulatory Visit | Attending: Surgery | Admitting: Surgery

## 2020-07-23 DIAGNOSIS — Z20822 Contact with and (suspected) exposure to covid-19: Secondary | ICD-10-CM | POA: Insufficient documentation

## 2020-07-23 DIAGNOSIS — Z01812 Encounter for preprocedural laboratory examination: Secondary | ICD-10-CM | POA: Insufficient documentation

## 2020-07-24 LAB — SARS CORONAVIRUS 2 (TAT 6-24 HRS): SARS Coronavirus 2: NEGATIVE

## 2020-07-25 MED ORDER — BUPIVACAINE LIPOSOME 1.3 % IJ SUSP
20.0000 mL | INTRAMUSCULAR | Status: DC
  Filled 2020-07-25: qty 20

## 2020-07-26 ENCOUNTER — Ambulatory Visit (HOSPITAL_COMMUNITY): Payer: Medicaid Other | Admitting: Anesthesiology

## 2020-07-26 ENCOUNTER — Encounter (HOSPITAL_COMMUNITY): Payer: Self-pay | Admitting: Surgery

## 2020-07-26 ENCOUNTER — Ambulatory Visit (HOSPITAL_COMMUNITY)
Admission: RE | Admit: 2020-07-26 | Discharge: 2020-07-26 | Disposition: A | Discharge status: Home / Self Care | Payer: Medicaid Other | Attending: Surgery | Admitting: Surgery

## 2020-07-26 ENCOUNTER — Encounter (HOSPITAL_COMMUNITY)
Admission: RE | Disposition: A | Discharge status: Home / Self Care | Payer: Self-pay | Source: Home / Self Care | Attending: Surgery

## 2020-07-26 DIAGNOSIS — K420 Umbilical hernia with obstruction, without gangrene: Secondary | ICD-10-CM | POA: Diagnosis not present

## 2020-07-26 DIAGNOSIS — F17210 Nicotine dependence, cigarettes, uncomplicated: Secondary | ICD-10-CM | POA: Insufficient documentation

## 2020-07-26 HISTORY — PX: INSERTION OF MESH: SHX5868

## 2020-07-26 HISTORY — DX: Other specified health status: Z78.9

## 2020-07-26 HISTORY — PX: UMBILICAL HERNIA REPAIR: SHX196

## 2020-07-26 HISTORY — DX: Palpitations: R00.2

## 2020-07-26 HISTORY — DX: Hypothyroidism, unspecified: E03.9

## 2020-07-26 HISTORY — DX: Encounter for adoption services: Z02.82

## 2020-07-26 LAB — CBC
HCT: 42.5 % (ref 36.0–46.0)
Hemoglobin: 13.7 g/dL (ref 12.0–15.0)
MCH: 28.2 pg (ref 26.0–34.0)
MCHC: 32.2 g/dL (ref 30.0–36.0)
MCV: 87.6 fL (ref 80.0–100.0)
Platelets: 356 10*3/uL (ref 150–400)
RBC: 4.85 MIL/uL (ref 3.87–5.11)
RDW: 13.7 % (ref 11.5–15.5)
WBC: 8.7 10*3/uL (ref 4.0–10.5)
nRBC: 0 % (ref 0.0–0.2)

## 2020-07-26 LAB — HCG, SERUM, QUALITATIVE: Preg, Serum: NEGATIVE

## 2020-07-26 SURGERY — REPAIR, HERNIA, UMBILICAL, LAPAROSCOPIC
Anesthesia: General | Site: Abdomen

## 2020-07-26 MED ORDER — DEXAMETHASONE SODIUM PHOSPHATE 10 MG/ML IJ SOLN
INTRAMUSCULAR | Status: AC
  Filled 2020-07-26: qty 1

## 2020-07-26 MED ORDER — ACETAMINOPHEN 160 MG/5ML PO SOLN: 325.0000 mg | ORAL | Status: DC | PRN

## 2020-07-26 MED ORDER — FENTANYL CITRATE (PF) 100 MCG/2ML IJ SOLN
25.0000 ug | INTRAMUSCULAR | Status: DC | PRN
  Administered 2020-07-26: 50 ug via INTRAVENOUS

## 2020-07-26 MED ORDER — CEFAZOLIN SODIUM-DEXTROSE 2-4 GM/100ML-% IV SOLN
2.0000 g | INTRAVENOUS | Status: AC
  Administered 2020-07-26: 2 g via INTRAVENOUS
  Filled 2020-07-26: qty 100

## 2020-07-26 MED ORDER — ENOXAPARIN SODIUM 40 MG/0.4ML ~~LOC~~ SOLN
40.0000 mg | Freq: Once | SUBCUTANEOUS | Status: AC
  Administered 2020-07-26: 40 mg via SUBCUTANEOUS
  Filled 2020-07-26: qty 0.4

## 2020-07-26 MED ORDER — LACTATED RINGERS IV SOLN: INTRAVENOUS | Status: DC

## 2020-07-26 MED ORDER — FENTANYL CITRATE (PF) 100 MCG/2ML IJ SOLN
INTRAMUSCULAR | Status: DC | PRN
  Administered 2020-07-26: 100 ug via INTRAVENOUS
  Administered 2020-07-26 (×2): 50 ug via INTRAVENOUS
  Administered 2020-07-26 (×4): 25 ug via INTRAVENOUS
  Administered 2020-07-26: 50 ug via INTRAVENOUS

## 2020-07-26 MED ORDER — ROCURONIUM BROMIDE 10 MG/ML (PF) SYRINGE
PREFILLED_SYRINGE | INTRAVENOUS | Status: AC
  Filled 2020-07-26: qty 10

## 2020-07-26 MED ORDER — KETOROLAC TROMETHAMINE 30 MG/ML IJ SOLN
INTRAMUSCULAR | Status: AC
  Filled 2020-07-26: qty 1

## 2020-07-26 MED ORDER — CHLORHEXIDINE GLUCONATE 0.12 % MT SOLN
15.0000 mL | Freq: Once | OROMUCOSAL | Status: AC
  Administered 2020-07-26: 15 mL via OROMUCOSAL

## 2020-07-26 MED ORDER — ESMOLOL HCL 100 MG/10ML IV SOLN
INTRAVENOUS | Status: DC | PRN
  Administered 2020-07-26 (×2): 20 mg via INTRAVENOUS
  Administered 2020-07-26: 10 mg via INTRAVENOUS

## 2020-07-26 MED ORDER — ESMOLOL HCL 100 MG/10ML IV SOLN
INTRAVENOUS | Status: AC
  Filled 2020-07-26: qty 10

## 2020-07-26 MED ORDER — LIDOCAINE HCL (CARDIAC) PF 100 MG/5ML IV SOSY
PREFILLED_SYRINGE | INTRAVENOUS | Status: DC | PRN
  Administered 2020-07-26: 50 mg via INTRAVENOUS

## 2020-07-26 MED ORDER — OXYCODONE HCL 5 MG PO TABS
5.0000 mg | ORAL_TABLET | Freq: Once | ORAL | Status: AC | PRN
  Administered 2020-07-26: 5 mg via ORAL

## 2020-07-26 MED ORDER — IBUPROFEN 800 MG PO TABS: 800.0000 mg | ORAL_TABLET | Freq: Three times a day (TID) | ORAL | 1 refills | Status: AC | PRN

## 2020-07-26 MED ORDER — FENTANYL CITRATE (PF) 100 MCG/2ML IJ SOLN
INTRAMUSCULAR | Status: AC
  Filled 2020-07-26: qty 2

## 2020-07-26 MED ORDER — PROPOFOL 10 MG/ML IV BOLUS
INTRAVENOUS | Status: DC | PRN
  Administered 2020-07-26: 150 mg via INTRAVENOUS
  Administered 2020-07-26: 50 mg via INTRAVENOUS

## 2020-07-26 MED ORDER — CHLORHEXIDINE GLUCONATE CLOTH 2 % EX PADS: 6.0000 | MEDICATED_PAD | Freq: Once | CUTANEOUS | Status: DC

## 2020-07-26 MED ORDER — SUGAMMADEX SODIUM 200 MG/2ML IV SOLN
INTRAVENOUS | Status: DC | PRN
  Administered 2020-07-26: 200 mg via INTRAVENOUS

## 2020-07-26 MED ORDER — ONDANSETRON HCL 4 MG/2ML IJ SOLN: 4.0000 mg | Freq: Once | INTRAMUSCULAR | Status: DC | PRN

## 2020-07-26 MED ORDER — ACETAMINOPHEN 500 MG PO TABS
1000.0000 mg | ORAL_TABLET | ORAL | Status: AC
  Administered 2020-07-26: 1000 mg via ORAL
  Filled 2020-07-26: qty 2

## 2020-07-26 MED ORDER — OXYCODONE HCL 5 MG/5ML PO SOLN: 5.0000 mg | Freq: Once | ORAL | Status: AC | PRN

## 2020-07-26 MED ORDER — MIDAZOLAM HCL 5 MG/5ML IJ SOLN
INTRAMUSCULAR | Status: DC | PRN
  Administered 2020-07-26 (×2): 1 mg via INTRAVENOUS

## 2020-07-26 MED ORDER — ACETAMINOPHEN 325 MG PO TABS: 325.0000 mg | ORAL_TABLET | ORAL | Status: DC | PRN

## 2020-07-26 MED ORDER — BUPIVACAINE LIPOSOME 1.3 % IJ SUSP
INTRAMUSCULAR | Status: DC | PRN
  Administered 2020-07-26: 20 mL

## 2020-07-26 MED ORDER — BUPIVACAINE-EPINEPHRINE (PF) 0.25% -1:200000 IJ SOLN
INTRAMUSCULAR | Status: AC
  Filled 2020-07-26: qty 30

## 2020-07-26 MED ORDER — KETOROLAC TROMETHAMINE 30 MG/ML IJ SOLN
30.0000 mg | Freq: Once | INTRAMUSCULAR | Status: AC | PRN
  Administered 2020-07-26: 30 mg via INTRAVENOUS

## 2020-07-26 MED ORDER — MEPERIDINE HCL 50 MG/ML IJ SOLN: 6.2500 mg | INTRAMUSCULAR | Status: DC | PRN

## 2020-07-26 MED ORDER — 0.9 % SODIUM CHLORIDE (POUR BTL) OPTIME
TOPICAL | Status: DC | PRN
  Administered 2020-07-26: 1000 mL

## 2020-07-26 MED ORDER — ONDANSETRON HCL 4 MG/2ML IJ SOLN
INTRAMUSCULAR | Status: AC
  Filled 2020-07-26: qty 2

## 2020-07-26 MED ORDER — MIDAZOLAM HCL 2 MG/2ML IJ SOLN
INTRAMUSCULAR | Status: AC
  Filled 2020-07-26: qty 2

## 2020-07-26 MED ORDER — OXYCODONE HCL 5 MG PO TABS: 5.0000 mg | ORAL_TABLET | Freq: Four times a day (QID) | ORAL | 0 refills | Status: AC | PRN

## 2020-07-26 MED ORDER — FENTANYL CITRATE (PF) 250 MCG/5ML IJ SOLN
INTRAMUSCULAR | Status: AC
  Filled 2020-07-26: qty 5

## 2020-07-26 MED ORDER — FENTANYL CITRATE (PF) 100 MCG/2ML IJ SOLN
INTRAMUSCULAR | Status: AC
  Filled 2020-07-26: qty 4

## 2020-07-26 MED ORDER — OXYCODONE HCL 5 MG PO TABS
ORAL_TABLET | ORAL | Status: AC
  Filled 2020-07-26: qty 1

## 2020-07-26 MED ORDER — ORAL CARE MOUTH RINSE: 15.0000 mL | Freq: Once | OROMUCOSAL | Status: AC

## 2020-07-26 MED ORDER — DEXAMETHASONE SODIUM PHOSPHATE 10 MG/ML IJ SOLN
INTRAMUSCULAR | Status: DC | PRN
  Administered 2020-07-26: 10 mg via INTRAVENOUS

## 2020-07-26 MED ORDER — BUPIVACAINE-EPINEPHRINE 0.25% -1:200000 IJ SOLN
INTRAMUSCULAR | Status: DC | PRN
  Administered 2020-07-26: 20 mL

## 2020-07-26 MED ORDER — ROCURONIUM BROMIDE 100 MG/10ML IV SOLN
INTRAVENOUS | Status: DC | PRN
  Administered 2020-07-26: 30 mg via INTRAVENOUS
  Administered 2020-07-26: 70 mg via INTRAVENOUS

## 2020-07-26 MED ORDER — ONDANSETRON HCL 4 MG/2ML IJ SOLN
INTRAMUSCULAR | Status: DC | PRN
  Administered 2020-07-26: 4 mg via INTRAVENOUS

## 2020-07-26 MED ORDER — DOCUSATE SODIUM 100 MG PO CAPS: 100.0000 mg | ORAL_CAPSULE | Freq: Two times a day (BID) | ORAL | 2 refills | Status: AC

## 2020-07-26 MED ORDER — LIDOCAINE HCL (PF) 2 % IJ SOLN
INTRAMUSCULAR | Status: AC
  Filled 2020-07-26: qty 5

## 2020-07-26 MED ORDER — PROPOFOL 10 MG/ML IV BOLUS
INTRAVENOUS | Status: AC
  Filled 2020-07-26: qty 20

## 2020-07-26 SURGICAL SUPPLY — 43 items
BINDER ABDOMINAL 12 ML 46-62 (SOFTGOODS) IMPLANT
CANISTER SUCT 3000ML PPV (MISCELLANEOUS) IMPLANT
CHLORAPREP W/TINT 26 (MISCELLANEOUS) ×2 IMPLANT
COVER SURGICAL LIGHT HANDLE (MISCELLANEOUS) ×2 IMPLANT
COVER WAND RF STERILE (DRAPES) ×2 IMPLANT
DERMABOND ADVANCED (GAUZE/BANDAGES/DRESSINGS) ×1
DERMABOND ADVANCED .7 DNX12 (GAUZE/BANDAGES/DRESSINGS) ×1 IMPLANT
DEVICE SECURE STRAP 25 ABSORB (INSTRUMENTS) ×2 IMPLANT
DEVICE TROCAR PUNCTURE CLOSURE (ENDOMECHANICALS) ×2 IMPLANT
DRAPE INCISE IOBAN 66X45 STRL (DRAPES) ×2 IMPLANT
ELECT CAUTERY BLADE TIP 2.5 (TIP)
ELECT REM PT RETURN 15FT ADLT (MISCELLANEOUS) ×2 IMPLANT
ELECTRODE CAUTERY BLDE TIP 2.5 (TIP) IMPLANT
GLOVE BIO SURGEON STRL SZ 6.5 (GLOVE) ×2 IMPLANT
GLOVE BIOGEL PI IND STRL 6 (GLOVE) ×1 IMPLANT
GLOVE BIOGEL PI INDICATOR 6 (GLOVE) ×1
GOWN STRL REUS W/ TWL LRG LVL3 (GOWN DISPOSABLE) ×3 IMPLANT
GOWN STRL REUS W/TWL LRG LVL3 (GOWN DISPOSABLE) ×6
GRASPER SUT TROCAR 14GX15 (MISCELLANEOUS) IMPLANT
KIT BASIN OR (CUSTOM PROCEDURE TRAY) ×2 IMPLANT
KIT TURNOVER KIT A (KITS) ×2 IMPLANT
MARKER SKIN DUAL TIP RULER LAB (MISCELLANEOUS) ×2 IMPLANT
MESH VENTRALIGHT ST 4.5IN (Mesh General) ×2 IMPLANT
NEEDLE SPNL 22GX3.5 QUINCKE BK (NEEDLE) IMPLANT
NS IRRIG 1000ML POUR BTL (IV SOLUTION) ×2 IMPLANT
PACK LAPAROSCOPY BASIN (CUSTOM PROCEDURE TRAY) ×2 IMPLANT
PAD ARMBOARD 7.5X6 YLW CONV (MISCELLANEOUS) ×4 IMPLANT
PENCIL BUTTON HOLSTER BLD 10FT (ELECTRODE) IMPLANT
SCISSORS LAP 5X35 DISP (ENDOMECHANICALS) IMPLANT
SET IRRIG TUBING LAPAROSCOPIC (IRRIGATION / IRRIGATOR) IMPLANT
SET TUBE SMOKE EVAC HIGH FLOW (TUBING) ×2 IMPLANT
SHEARS HARMONIC ACE PLUS 36CM (ENDOMECHANICALS) IMPLANT
SLEEVE ENDOPATH XCEL 5M (ENDOMECHANICALS) ×4 IMPLANT
SUT ETHIBOND CT1 BRD #0 30IN (SUTURE) IMPLANT
SUT MNCRL AB 4-0 PS2 18 (SUTURE) ×2 IMPLANT
SUT NOVA NAB DX-16 0-1 5-0 T12 (SUTURE) ×2 IMPLANT
TOWEL OR 17X26 10 PK STRL BLUE (TOWEL DISPOSABLE) ×2 IMPLANT
TOWEL OR NON WOVEN STRL DISP B (DISPOSABLE) ×2 IMPLANT
TRAY FOLEY MTR SLVR 16FR STAT (SET/KITS/TRAYS/PACK) IMPLANT
TROCAR XCEL BLUNT TIP 100MML (ENDOMECHANICALS) IMPLANT
TROCAR XCEL NON-BLD 11X100MML (ENDOMECHANICALS) ×2 IMPLANT
TROCAR XCEL NON-BLD 5MMX100MML (ENDOMECHANICALS) ×2 IMPLANT
WATER STERILE IRR 1000ML POUR (IV SOLUTION) ×2 IMPLANT

## 2020-07-26 NOTE — Anesthesia Postprocedure Evaluation (Signed)
Anesthesia Post Note  Patient: Briana Harrell  Procedure(s) Performed: LAPAROSCOPIC UMBILICAL HERNIA REPAIR (N/A Abdomen) INSERTION OF MESH (N/A Abdomen)     Patient location during evaluation: Phase II Anesthesia Type: General Level of consciousness: awake Pain management: pain level controlled Vital Signs Assessment: post-procedure vital signs reviewed and stable Respiratory status: spontaneous breathing Cardiovascular status: stable Postop Assessment: no apparent nausea or vomiting Anesthetic complications: no   No complications documented.  Last Vitals:  Vitals:   07/26/20 1400 07/26/20 1450  BP: 119/82 (!) 131/95  Pulse: 75 77  Resp:    Temp:    SpO2: 92% 96%    Last Pain:  Vitals:   07/26/20 1450  TempSrc:   PainSc: 2    Pain Goal: Patients Stated Pain Goal: 3 (07/19/20 1003)                 Caren Macadam

## 2020-07-26 NOTE — Discharge Instructions (Signed)
May shower beginning 07/27/2020. Do not peel off or scrub skin glue. May allow warm soapy water to run over incision, then rinse and pat dry. Do not soak in any water (tubs, hot tubs, pools, lakes, oceans) for one week.   No lifting greater than 5 pounds for six weeks.   Call the office at [phone] for temperature greater than 101.96F, worsening pain, redness or warmth at the incision site.  Please call [phone] to make an appointment for 1 week after surgery for wound check.

## 2020-07-26 NOTE — Op Note (Signed)
   Operative Note   Date: 07/26/2020  Procedure: laparoscopic umbilical hernia repair with mesh  Pre-op diagnosis: incarcerated umbilical hernia Post-op diagnosis: same  Indication and clinical history: The patient is a 40 y.o. year old female with a symptomatic incarcerated umbilical hernia     Surgeon: Diamantina Monks, MD Assistant: Iran Sizer, [place]  Anesthesiologist: Arby Barrette, MD Anesthesia: General  Findings:  . Specimen: none . EBL: <5cc . Drains/Implants: 4.5in circular permanent mesh  Disposition: PACU - hemodynamically stable.  Description of procedure: The patient was positioned supine on the operating room table. General anesthetic induction was uneventful, however intubation required multiple attempts with direct laryngoscopy that was ultimately converted to glidescope. Time-out was performed verifying correct patient, procedure, signature of informed consent, and administration of pre-operative antibiotics. The abdomen was prepped and draped in the usual sterile fashion, including the use of an ioban drape.   A Veress needle was used in the left upper quadrant to insufflate the abdomen and the abdomen entered using a 13mm port and an optiview technique. No injury to any intra-abdominal structure was identified. Two additional 29mm ports were placed in the left abdomen under direct visualization and after administration of local anesthetic mixed with exparel. The incarcerated fat was resected and a 4.5in circular mesh was labeled in four quadrants and #1 novofil suture secured to it. The inferior port site was upsized to an 50mm port to allow the mesh to be inserted into the abdomen. After orientation in the abdomen, a suture passer was used to secure the mesh in four quadrants and a 63mm absorbable tacker used to circumferentially secure it. A photo was taken for the chart. A suture passer was used to close the fascia of the 31mm port site and this was performed under direct  visualization. The skin of all port sites were closed with 4-0 monocryl suture. All wounds were dressed with dermabond as sterile dressing.   All sponge and instrument counts were correct at the conclusion of the procedure. The patient was awakened from anesthesia, extubated uneventfully, and transported to the PACU in good condition. There were no complications.    Diamantina Monks, MD General and Trauma Surgery [facility]

## 2020-07-26 NOTE — Transfer of Care (Signed)
Immediate Anesthesia Transfer of Care Note  Patient: Briana Harrell  Procedure(s) Performed: LAPAROSCOPIC UMBILICAL HERNIA REPAIR (N/A Abdomen) INSERTION OF MESH (N/A Abdomen)  Patient Location: PACU  Anesthesia Type:General  Level of Consciousness: awake, alert , oriented and patient cooperative  Airway & Oxygen Therapy: Patient Spontanous Breathing and Patient connected to face mask oxygen  Post-op Assessment: Report given to RN and Post -op Vital signs reviewed and stable  Post vital signs: Reviewed and stable  Last Vitals:  Vitals Value Taken Time  BP 141/96 07/26/20 1241  Temp    Pulse 92 07/26/20 1243  Resp 21 07/26/20 1243  SpO2 95 % 07/26/20 1243  Vitals shown include unvalidated device data.  Last Pain:  Vitals:   07/26/20 0846  TempSrc: Oral  PainSc:       Patients Stated Pain Goal: 3 (07/19/20 1003)  Complications: No complications documented.

## 2020-07-26 NOTE — Anesthesia Procedure Notes (Signed)
Procedure Name: Intubation Date/Time: 07/26/2020 10:59 AM Performed by: Garth Bigness, CRNA Pre-anesthesia Checklist: Patient identified, Emergency Drugs available, Suction available and Patient being monitored Patient Re-evaluated:Patient Re-evaluated prior to induction Oxygen Delivery Method: Circle system utilized Preoxygenation: Pre-oxygenation with 100% oxygen Induction Type: IV induction Ventilation: Mask ventilation without difficulty Laryngoscope Size: Glidescope Grade View: Grade II Tube type: Oral Tube size: 7.0 mm Number of attempts: 1 Airway Equipment and Method: Oral airway,  Video-laryngoscopy and Rigid stylet Placement Confirmation: ETT inserted through vocal cords under direct vision,  positive ETCO2 and breath sounds checked- equal and bilateral Secured at: 22 cm Tube secured with: Tape Dental Injury: Teeth and Oropharynx as per pre-operative assessment  Difficulty Due To: Difficulty was unanticipated

## 2020-07-26 NOTE — Anesthesia Preprocedure Evaluation (Signed)
Anesthesia Evaluation  Patient identified by MRN, date of birth, ID band Patient awake    Reviewed: Allergy & Precautions, H&P , NPO status , Patient's Chart, lab work & pertinent test results  Airway Mallampati: I       Dental no notable dental hx.    Pulmonary neg pulmonary ROS, Current Smoker and Patient abstained from smoking.,    Pulmonary exam normal        Cardiovascular negative cardio ROS Normal cardiovascular exam     Neuro/Psych  Headaches, negative psych ROS   GI/Hepatic negative GI ROS, Neg liver ROS,   Endo/Other  Hypothyroidism   Renal/GU negative Renal ROS  negative genitourinary   Musculoskeletal negative musculoskeletal ROS (+)   Abdominal Normal abdominal exam  (+)   Peds  Hematology negative hematology ROS (+)   Anesthesia Other Findings   Reproductive/Obstetrics negative OB ROS                             Anesthesia Physical Anesthesia Plan  ASA: II  Anesthesia Plan: General   Post-op Pain Management:    Induction: Intravenous  PONV Risk Score and Plan: 4 or greater and Ondansetron, Dexamethasone and Midazolam  Airway Management Planned: Oral ETT  Additional Equipment: None  Intra-op Plan:   Post-operative Plan:   Informed Consent: I have reviewed the patients History and Physical, chart, labs and discussed the procedure including the risks, benefits and alternatives for the proposed anesthesia with the patient or authorized representative who has indicated his/her understanding and acceptance.     Dental advisory given  Plan Discussed with: CRNA  Anesthesia Plan Comments:         Anesthesia Quick Evaluation

## 2020-07-26 NOTE — H&P (Signed)
Briana Harrell is an 40 y.o. female.   HPI: 4F with symptomatic umbilical hernia. The patient has had no hospitalizations, doctors visits, ER visits, surgeries, or newly diagnosed allergies since being seen in the office.    Past Medical History:  Diagnosis Date  . Adopted   . Chills   . Constipation   . Headache(784.0)   . Heart palpitations    no problems since starting Levothyroxine  . Hypothyroidism   . Nodule of left lobe of thyroid gland 2013    . Sore throat   . Trouble swallowing   . Weakness     History reviewed. No pertinent surgical history.  History reviewed. No pertinent family history.  Social History:  reports that she has been smoking cigarettes. She has been smoking about 0.25 packs per day. She has never used smokeless tobacco. She reports previous alcohol use. She reports previous drug use. Drugs: Cocaine and Marijuana.  Allergies: No Known Allergies  Medications: I have reviewed the patient's current medications.  Results for orders placed or performed during the hospital encounter of 07/26/20 (from the past 48 hour(s))  CBC per protocol     Status: None   Collection Time: 07/26/20  8:42 AM  Result Value Ref Range   WBC 8.7 4.0 - 10.5 K/uL   RBC 4.85 3.87 - 5.11 MIL/uL   Hemoglobin 13.7 12.0 - 15.0 g/dL   HCT 89.3 81.0 - 17.5 %   MCV 87.6 80.0 - 100.0 fL   MCH 28.2 26.0 - 34.0 pg   MCHC 32.2 30.0 - 36.0 g/dL   RDW 10.2 58.5 - 27.7 %   Platelets 356 150 - 400 K/uL   nRBC 0.0 0.0 - 0.2 %    Comment: Performed at [facility], [address] [place], [place] [address]  hCG, serum, qualitative (Not at [facility])     Status: None   Collection Time: 07/26/20  8:42 AM  Result Value Ref Range   Preg, Serum NEGATIVE NEGATIVE    Comment:        THE SENSITIVITY OF THIS METHODOLOGY IS >10 mIU/mL. Performed at [facility], [address], [place], [place] [address]     No results found.  ROS 10 point  review of systems is negative except as listed above in HPI.   Physical Exam Blood pressure 113/72, pulse (!) 59, temperature 98 F (36.7 C), temperature source Oral, resp. rate 16, height 5\' 7"  (1.702 m), weight 87.1 kg, last menstrual period 07/16/2020, SpO2 95 %. Constitutional: well-developed, well-nourished HEENT: pupils equal, round, reactive to light, 50mm b/l, moist conjunctiva, external inspection of ears and nose normal, hearing intact Oropharynx: normal oropharyngeal mucosa, normal dentition Neck: no thyromegaly, trachea midline, no midline cervical tenderness to palpation Chest: breath sounds equal bilaterally, normal respiratory effort, no midline or lateral chest wall tenderness to palpation/deformity Abdomen: soft, NT, incarcerated umbilical hernia, mildly tender to palpation, no bruising, no hepatosplenomegaly GU: normal female genitalia  Back: no wounds, no thoracic/lumbar spine tenderness to palpation, no thoracic/lumbar spine stepoffs Rectal: deferred Extremities: 2+ radial and pedal pulses bilaterally, motor and sensation intact to bilateral UE and LE, no peripheral edema MSK: normal gait/station, no clubbing/cyanosis of fingers/toes, normal ROM of all four extremities Skin: warm, dry, no rashes Psych: normal memory, normal mood/affect    Assessment/Plan: 4F with umbilical hernia. Plan for lap UHR with mesh. Informed consent was obtained after detailed explanation of risks, including bleeding, infection, hematoma/seroma, injury to  surrounding structures, recurrence, mesh infection requiring explantation, and need for conversion to an open procedure. All questions answered to the patient's satisfaction.   Diamantina Monks, MD General and Trauma Surgery [facility]

## 2020-07-27 ENCOUNTER — Encounter (HOSPITAL_COMMUNITY): Payer: Self-pay | Admitting: Surgery

## 2020-08-17 ENCOUNTER — Other Ambulatory Visit (HOSPITAL_COMMUNITY): Payer: Medicaid Other

## 2022-06-13 ENCOUNTER — Other Ambulatory Visit: Payer: Self-pay | Admitting: Internal Medicine

## 2022-06-14 LAB — COMPLETE METABOLIC PANEL WITH GFR
AG Ratio: 1.5 (calc) (ref 1.0–2.5)
ALT: 9 U/L (ref 6–29)
AST: 11 U/L (ref 10–30)
Albumin: 3.8 g/dL (ref 3.6–5.1)
Alkaline phosphatase (APISO): 77 U/L (ref 31–125)
BUN: 8 mg/dL (ref 7–25)
CO2: 25 mmol/L (ref 20–32)
Calcium: 9.1 mg/dL (ref 8.6–10.2)
Chloride: 104 mmol/L (ref 98–110)
Creat: 0.64 mg/dL (ref 0.50–0.99)
Globulin: 2.5 g/dL (calc) (ref 1.9–3.7)
Glucose, Bld: 95 mg/dL (ref 65–99)
Potassium: 4.3 mmol/L (ref 3.5–5.3)
Sodium: 138 mmol/L (ref 135–146)
Total Bilirubin: 0.3 mg/dL (ref 0.2–1.2)
Total Protein: 6.3 g/dL (ref 6.1–8.1)
eGFR: 114 mL/min/{1.73_m2} (ref >=60)

## 2022-06-14 LAB — LIPID PANEL
Cholesterol: 240 mg/dL — ABNORMAL HIGH (ref <200)
HDL: 59 mg/dL (ref >=50)
LDL Cholesterol (Calc): 152 mg/dL (calc) — ABNORMAL HIGH
Non-HDL Cholesterol (Calc): 181 mg/dL (calc) — ABNORMAL HIGH (ref <130)
Total CHOL/HDL Ratio: 4.1 (calc) (ref <5.0)
Triglycerides: 158 mg/dL — ABNORMAL HIGH (ref <150)

## 2022-06-14 LAB — CBC
HCT: 40.6 % (ref 35.0–45.0)
Hemoglobin: 13.4 g/dL (ref 11.7–15.5)
MCH: 30.5 pg (ref 27.0–33.0)
MCHC: 33 g/dL (ref 32.0–36.0)
MCV: 92.5 fL (ref 80.0–100.0)
MPV: 10.1 fL (ref 7.5–12.5)
Platelets: 334 10*3/uL (ref 140–400)
RBC: 4.39 10*6/uL (ref 3.80–5.10)
RDW: 14.2 % (ref 11.0–15.0)
WBC: 10 10*3/uL (ref 3.8–10.8)

## 2022-06-14 LAB — T4, FREE: Free T4: 0.9 ng/dL (ref 0.8–1.8)

## 2022-06-14 LAB — TSH: TSH: 14.07 mIU/L — ABNORMAL HIGH

## 2022-06-14 LAB — VITAMIN D 25 HYDROXY (VIT D DEFICIENCY, FRACTURES): Vit D, 25-Hydroxy: 10 ng/mL — ABNORMAL LOW (ref 30–100)

## 2022-09-27 ENCOUNTER — Other Ambulatory Visit: Payer: Self-pay

## 2022-09-27 ENCOUNTER — Encounter (HOSPITAL_BASED_OUTPATIENT_CLINIC_OR_DEPARTMENT_OTHER): Payer: Self-pay | Admitting: Pediatrics

## 2022-09-27 ENCOUNTER — Emergency Department (HOSPITAL_BASED_OUTPATIENT_CLINIC_OR_DEPARTMENT_OTHER)
Admission: EM | Admit: 2022-09-27 | Discharge: 2022-09-27 | Disposition: A | Discharge status: Home / Self Care | Payer: Medicaid Other | Attending: Emergency Medicine | Admitting: Emergency Medicine

## 2022-09-27 DIAGNOSIS — H6691 Otitis media, unspecified, right ear: Secondary | ICD-10-CM | POA: Insufficient documentation

## 2022-09-27 DIAGNOSIS — N760 Acute vaginitis: Secondary | ICD-10-CM | POA: Insufficient documentation

## 2022-09-27 DIAGNOSIS — H66001 Acute suppurative otitis media without spontaneous rupture of ear drum, right ear: Secondary | ICD-10-CM

## 2022-09-27 DIAGNOSIS — A599 Trichomoniasis, unspecified: Secondary | ICD-10-CM | POA: Diagnosis not present

## 2022-09-27 DIAGNOSIS — B9689 Other specified bacterial agents as the cause of diseases classified elsewhere: Secondary | ICD-10-CM | POA: Diagnosis not present

## 2022-09-27 DIAGNOSIS — H9201 Otalgia, right ear: Secondary | ICD-10-CM | POA: Diagnosis present

## 2022-09-27 LAB — URINALYSIS, ROUTINE W REFLEX MICROSCOPIC
Bilirubin Urine: NEGATIVE
Glucose, UA: NEGATIVE mg/dL
Ketones, ur: NEGATIVE mg/dL
Nitrite: NEGATIVE
Protein, ur: NEGATIVE mg/dL
Specific Gravity, Urine: 1.01 (ref 1.005–1.030)
pH: 6.5 (ref 5.0–8.0)

## 2022-09-27 LAB — URINALYSIS, MICROSCOPIC (REFLEX)

## 2022-09-27 LAB — WET PREP, GENITAL
Sperm: NONE SEEN
WBC, Wet Prep HPF POC: >=10 — AB (ref <10)
Yeast Wet Prep HPF POC: NONE SEEN

## 2022-09-27 LAB — PREGNANCY, URINE: Preg Test, Ur: NEGATIVE

## 2022-09-27 MED ORDER — METRONIDAZOLE 500 MG PO TABS: 500.0000 mg | ORAL_TABLET | Freq: Two times a day (BID) | ORAL | 0 refills | Status: DC

## 2022-09-27 MED ORDER — CEFTRIAXONE SODIUM 500 MG IJ SOLR
500.0000 mg | Freq: Once | INTRAMUSCULAR | Status: AC
  Administered 2022-09-27: 500 mg via INTRAMUSCULAR
  Filled 2022-09-27: qty 500

## 2022-09-27 MED ORDER — FLUCONAZOLE 150 MG PO TABS: 150.0000 mg | ORAL_TABLET | Freq: Every day | ORAL | 1 refills | Status: AC

## 2022-09-27 MED ORDER — AZITHROMYCIN 250 MG PO TABS
1000.0000 mg | ORAL_TABLET | Freq: Every day | ORAL | Status: DC
  Administered 2022-09-27: 1000 mg via ORAL
  Filled 2022-09-27: qty 4

## 2022-09-27 MED ORDER — AMOXICILLIN 875 MG PO TABS: 875.0000 mg | ORAL_TABLET | Freq: Two times a day (BID) | ORAL | 0 refills | Status: AC

## 2022-09-27 MED ORDER — METRONIDAZOLE 500 MG PO TABS
500.0000 mg | ORAL_TABLET | Freq: Once | ORAL | Status: AC
  Administered 2022-09-27: 500 mg via ORAL
  Filled 2022-09-27: qty 1

## 2022-09-27 MED ORDER — DOXYCYCLINE HYCLATE 100 MG PO TABS: 100.0000 mg | ORAL_TABLET | Freq: Once | ORAL | Status: DC

## 2022-09-27 MED ORDER — LIDOCAINE HCL (PF) 1 % IJ SOLN
INTRAMUSCULAR | Status: AC
  Administered 2022-09-27: 1.6 mL
  Filled 2022-09-27: qty 5

## 2022-09-27 MED ORDER — METRONIDAZOLE 500 MG/100ML IV SOLN: 500.0000 mg | Freq: Once | INTRAVENOUS | Status: DC

## 2022-09-27 NOTE — ED Notes (Signed)
Pt performed self-swab for wet prep etc collection

## 2022-09-27 NOTE — Discharge Instructions (Signed)
For right ear infection and STI testing.  You tested positive for bacterial vaginitis and trichomoniasis.  You are being treated for Flagyl with this.  Your also treated for gonorrhea and chlamydia.  Your tests for these are not back yet.  You to follow-up close with your primary care doctor or the health department for more complete STI testing.  Make sure you finish all the antibiotics, atrial your primary for treatment.

## 2022-09-27 NOTE — ED Triage Notes (Signed)
C/o right ear pain and concern for STD; states no symptoms but she feels like theres an infection brewing. Reports spotting on 09/22/22

## 2022-09-27 NOTE — ED Provider Notes (Signed)
[facility] Provider Note   CSN: [id number] Arrival date & time: 09/27/22  1645     History  Chief Complaint  Patient presents with   Otalgia   Exposure to STD    Briana Harrell is a 42 y.o. female.  History of schizoaffective disorder and seizures.  Presents the ER today for right ear pain for the past 2 days with history of ear infection in the past this feels the same.  She also has some sinus congestion.  No fevers or chills, trouble swallowing or breathing.  She denies swelling to the ear.  She also would like to be tested for pregnancy and STIs.  She states she is sexually active currently with only female partners and denies any discharge or dysuria but wants to be checked because she feels like there is an infection on her body.    Otalgia Exposure to STD       Home Medications Prior to Admission medications   Medication Sig Start Date End Date Taking? Authorizing Provider  ARIPiprazole (ABILIFY) 10 MG tablet Take 10 mg by mouth daily.    [provider]  Biotin w/ Vitamins C & E (HAIR/SKIN/NAILS PO) Take 1 tablet by mouth daily.    [provider]  Cholecalciferol (VITAMIN D3 ULTRA POTENCY) 1.25 MG (50000 UT) TABS Take 1 tablet by mouth once a week.    [provider]  ibuprofen (ADVIL) 800 MG tablet Take 1 tablet (800 mg total) by mouth every 8 (eight) hours as needed. 07/26/20   Jesusita Oka, MD  levothyroxine (SYNTHROID, LEVOTHROID) 200 MCG tablet Take 200 mcg by mouth daily.    [provider]  oxyCODONE (ROXICODONE) 5 MG immediate release tablet Take 1 tablet (5 mg total) by mouth every 6 (six) hours as needed for severe pain. 07/26/20   Jesusita Oka, MD      Allergies    Patient has no known allergies.    Review of Systems   Review of Systems  HENT:  Positive for ear pain.     Physical Exam Updated Vital Signs BP (!) 138/99 (BP Location: Left Arm)   Pulse (!) 108   Temp  98.3 F (36.8 C) (Oral)   Resp 18   Ht '5\' 7"'$  (1.702 m)   Wt 79.4 kg   LMP 09/22/2022   SpO2 100%   BMI 27.41 kg/m  Physical Exam Vitals and nursing note reviewed.  Constitutional:      General: She is not in acute distress.    Appearance: She is well-developed.  HENT:     Head: Normocephalic and atraumatic.     Right Ear: Ear canal normal. Tympanic membrane is erythematous and bulging.     Left Ear: Tympanic membrane and ear canal normal.  Eyes:     Conjunctiva/sclera: Conjunctivae normal.  Cardiovascular:     Rate and Rhythm: Normal rate and regular rhythm.     Heart sounds: No murmur heard. Pulmonary:     Effort: Pulmonary effort is normal. No respiratory distress.     Breath sounds: Normal breath sounds.  Abdominal:     Palpations: Abdomen is soft.     Tenderness: There is no abdominal tenderness.  Musculoskeletal:        General: No swelling.     Cervical back: Neck supple.  Skin:    General: Skin is warm and dry.     Capillary Refill: Capillary refill takes less than 2  seconds.  Neurological:     Mental Status: She is alert.  Psychiatric:        Mood and Affect: Mood normal.     ED Results / Procedures / Treatments   Labs (all labs ordered are listed, but only abnormal results are displayed) Labs Reviewed  URINALYSIS, ROUTINE W REFLEX MICROSCOPIC - Abnormal; Notable for the following components:      Result Value   Color, Urine STRAW (*)    Hgb urine dipstick MODERATE (*)    Leukocytes,Ua SMALL (*)    All other components within normal limits  URINALYSIS, MICROSCOPIC (REFLEX) - Abnormal; Notable for the following components:   Bacteria, UA RARE (*)    Trichomonas, UA PRESENT (*)    All other components within normal limits  WET PREP, GENITAL  PREGNANCY, URINE  GC/CHLAMYDIA PROBE AMP ([place]) NOT AT [facility]    EKG None  Radiology No results found.  Procedures Procedures    Medications Ordered in ED Medications - No data to display  ED  Course/ Medical Decision Making/ A&P                             Medical Decision Making This patient presents to the ED for concern of right ear pain and wanting STI testing, this involves an extensive number of treatment options, and is a complaint that carries with it a high risk of complications and morbidity.  The differential diagnosis includes dyspnea, otitis externa, mastoiditis, periapical abscess, TMJ, STI, PID, UTI, other   Co morbidities that complicate the patient evaluation  Schizoaffective disorder   Additional history obtained:  Additional history obtained from EMR External records from outside source obtained and reviewed including prior outpatient notes   Lab Tests:  I Ordered, and personally interpreted labs.  The pertinent results include:  ua shows recommends.  Patient does have small leuks and 11-20 white blood cells but is not having dysuria frequency or urgency.  Likely from her trichomonas.  Wet mount is positive for trichomoniasis and BV.    Problem List / ED Course / Critical interventions / Medication management  Presents with right ear pain and does have otitis media will treat with amoxicillin.  She also wanted STI testing, denies vaginal discharge, she has no abdominal pain or tenderness, denies vaginal discharge.  She is positive for trichomoniasis.  Discussed with her positive BV on her wet mount.  Advised to inform any partners as they can be treated as well.  Advised on importance of finishing antibiotics, treated empirically for gonorrhea and chlamydia, given concern for compliance especially setting of multiple antibiotics opted for single dose of Zithromax rather than full course of doxycycline empirically for chlamydia to ensure treatment compliance.  Also requested fluconazole for yeast infection should she develop with antibiotics as she frequently gets this when she takes antibiotics which was ordered for her.  I have reviewed the patients home  medicines and have made adjustments as needed     Amount and/or Complexity of Data Reviewed Labs: ordered.           Final Clinical Impression(s) / ED Diagnoses Final diagnoses:  None    Rx / DC Orders ED Discharge Orders     None         Gwenevere Abbot, PA-C 09/27/22 [place], [place], DO 09/27/22 1759

## 2022-09-28 LAB — GC/CHLAMYDIA PROBE AMP (~~LOC~~) NOT AT ARMC
Chlamydia: NEGATIVE
Comment: NEGATIVE
Comment: NORMAL
Neisseria Gonorrhea: NEGATIVE

## 2023-12-22 ENCOUNTER — Emergency Department (HOSPITAL_BASED_OUTPATIENT_CLINIC_OR_DEPARTMENT_OTHER)
Admission: EM | Admit: 2023-12-22 | Discharge: 2023-12-22 | Disposition: A | Discharge status: Home / Self Care | Attending: Emergency Medicine | Admitting: Emergency Medicine

## 2023-12-22 ENCOUNTER — Other Ambulatory Visit: Payer: Self-pay

## 2023-12-22 ENCOUNTER — Encounter (HOSPITAL_BASED_OUTPATIENT_CLINIC_OR_DEPARTMENT_OTHER): Payer: Self-pay

## 2023-12-22 DIAGNOSIS — Z79899 Other long term (current) drug therapy: Secondary | ICD-10-CM | POA: Diagnosis not present

## 2023-12-22 DIAGNOSIS — F17210 Nicotine dependence, cigarettes, uncomplicated: Secondary | ICD-10-CM | POA: Diagnosis not present

## 2023-12-22 DIAGNOSIS — Z202 Contact with and (suspected) exposure to infections with a predominantly sexual mode of transmission: Secondary | ICD-10-CM | POA: Diagnosis not present

## 2023-12-22 DIAGNOSIS — E039 Hypothyroidism, unspecified: Secondary | ICD-10-CM | POA: Diagnosis not present

## 2023-12-22 DIAGNOSIS — R0981 Nasal congestion: Secondary | ICD-10-CM | POA: Diagnosis not present

## 2023-12-22 DIAGNOSIS — R300 Dysuria: Secondary | ICD-10-CM | POA: Insufficient documentation

## 2023-12-22 DIAGNOSIS — Z113 Encounter for screening for infections with a predominantly sexual mode of transmission: Secondary | ICD-10-CM

## 2023-12-22 DIAGNOSIS — A599 Trichomoniasis, unspecified: Secondary | ICD-10-CM

## 2023-12-22 LAB — WET PREP, GENITAL
Clue Cells Wet Prep HPF POC: NONE SEEN
Sperm: NONE SEEN
WBC, Wet Prep HPF POC: <10 (ref <10)
Yeast Wet Prep HPF POC: NONE SEEN

## 2023-12-22 LAB — PREGNANCY, URINE: Preg Test, Ur: NEGATIVE

## 2023-12-22 LAB — URINALYSIS, ROUTINE W REFLEX MICROSCOPIC
Bilirubin Urine: NEGATIVE
Glucose, UA: NEGATIVE mg/dL
Ketones, ur: NEGATIVE mg/dL
Nitrite: NEGATIVE
Protein, ur: NEGATIVE mg/dL
Specific Gravity, Urine: 1.015 (ref 1.005–1.030)
pH: 6.5 (ref 5.0–8.0)

## 2023-12-22 LAB — URINALYSIS, MICROSCOPIC (REFLEX)

## 2023-12-22 MED ORDER — BENZONATATE 100 MG PO CAPS: 100.0000 mg | ORAL_CAPSULE | Freq: Three times a day (TID) | ORAL | 0 refills | Status: AC | PRN

## 2023-12-22 MED ORDER — FLUCONAZOLE 150 MG PO TABS: 150.0000 mg | ORAL_TABLET | Freq: Once | ORAL | 0 refills | Status: AC

## 2023-12-22 MED ORDER — CEFADROXIL 500 MG PO CAPS: 500.0000 mg | ORAL_CAPSULE | Freq: Two times a day (BID) | ORAL | 0 refills | Status: AC

## 2023-12-22 MED ORDER — METRONIDAZOLE 500 MG PO TABS: 500.0000 mg | ORAL_TABLET | Freq: Two times a day (BID) | ORAL | 0 refills | Status: AC

## 2023-12-22 MED ORDER — TRIAMCINOLONE ACETONIDE 55 MCG/ACT NA AERO: 2.0000 | INHALATION_SPRAY | Freq: Every day | NASAL | 0 refills | Status: AC

## 2023-12-22 NOTE — Discharge Instructions (Signed)
 As discussed, your urine appeared infected.  Will put on antibiotic for this.  Regarding your nasal congestion, will recommend Nasacort for treatment of this.  You may continue to use nasal saline irrigation as needed.  Will also send you with cough medicine to use as needed.  Follow MyChart for results of STD testing.  Return to the ER, urgent care, health department if you test positive.

## 2023-12-22 NOTE — ED Provider Notes (Signed)
 [facility] Provider Note   CSN: [id number] Arrival date & time: 12/22/23  1436     History  Chief Complaint  Patient presents with   **    Briana Harrell is a 43 y.o. female.  HPI   43 year old female presents emergency department with request of STI testing.  Also states that she has had nasal congestion for about a month.  States she has intermittently felt like her ears have at pressure.  Has tried Afrin nasal spray as well as nasal saline with some improvement of symptoms.  Denies any fevers, chills, sinus pressure, sore throat, chest pain, shortness of breath.  Denies any abdominal pain, vaginal itching/discharge.  Does report some feelings of dysuria over the past couple of days.  Denies any known STD exposure.  Past medical history significant for hypothyroidism, palpitations  Home Medications Prior to Admission medications   Medication Sig Start Date End Date Taking? Authorizing Provider  ARIPiprazole (ABILIFY) 10 MG tablet Take 10 mg by mouth daily.    [provider]  Biotin w/ Vitamins C & E (HAIR/SKIN/NAILS PO) Take 1 tablet by mouth daily.    [provider]  Cholecalciferol (VITAMIN D3 ULTRA POTENCY) 1.25 MG (50000 UT) TABS Take 1 tablet by mouth once a week.    [provider]  ibuprofen  (ADVIL ) 800 MG tablet Take 1 tablet (800 mg total) by mouth every 8 (eight) hours as needed. 07/26/20   Anda Bamberg, MD  levothyroxine (SYNTHROID, LEVOTHROID) 200 MCG tablet Take 200 mcg by mouth daily.    [provider]  metroNIDAZOLE  (FLAGYL ) 500 MG tablet Take 1 tablet (500 mg total) by mouth 2 (two) times daily. 09/27/22   Baxter Limber A, PA-C  oxyCODONE  (ROXICODONE ) 5 MG immediate release tablet Take 1 tablet (5 mg total) by mouth every 6 (six) hours as needed for severe pain. 07/26/20   Anda Bamberg, MD      Allergies    Patient has no known allergies.    Review of Systems   Review of  Systems  All other systems reviewed and are negative.   Physical Exam Updated Vital Signs BP (!) 157/99   Pulse 96   Temp 98.4 F (36.9 C) (Oral)   Resp 18   Ht 5\' 6"  (1.676 m)   Wt 68 kg   SpO2 98%   BMI 24.21 kg/m  Physical Exam Vitals and nursing note reviewed.  Constitutional:      General: She is not in acute distress.    Appearance: She is well-developed.  HENT:     Head: Normocephalic and atraumatic.     Right Ear: Ear canal and external ear normal.     Left Ear: Ear canal and external ear normal.     Ears:     Comments: Serous fluid middle ear effusion bilaterally.  Nonerythematous TM.    Nose: Congestion and rhinorrhea present.  Eyes:     Conjunctiva/sclera: Conjunctivae normal.  Cardiovascular:     Rate and Rhythm: Normal rate and regular rhythm.     Heart sounds: No murmur heard. Pulmonary:     Effort: Pulmonary effort is normal. No respiratory distress.     Breath sounds: Normal breath sounds. No wheezing, rhonchi or rales.  Abdominal:     Palpations: Abdomen is soft.     Tenderness: There is no abdominal tenderness. There is no guarding.  Musculoskeletal:        General: No  swelling.     Cervical back: Neck supple.  Skin:    General: Skin is warm and dry.     Capillary Refill: Capillary refill takes less than 2 seconds.  Neurological:     Mental Status: She is alert.  Psychiatric:        Mood and Affect: Mood normal.     ED Results / Procedures / Treatments   Labs (all labs ordered are listed, but only abnormal results are displayed) Labs Reviewed  URINALYSIS, ROUTINE W REFLEX MICROSCOPIC - Abnormal; Notable for the following components:      Result Value   Hgb urine dipstick LARGE (*)    Leukocytes,Ua TRACE (*)    All other components within normal limits  URINALYSIS, MICROSCOPIC (REFLEX) - Abnormal; Notable for the following components:   Bacteria, UA RARE (*)    All other components within normal limits  PREGNANCY, URINE  GC/CHLAMYDIA  PROBE AMP ([place]) NOT AT [facility]    EKG None  Radiology No results found.  Procedures Procedures    Medications Ordered in ED Medications - No data to display  ED Course/ Medical Decision Making/ A&P                                 Medical Decision Making Amount and/or Complexity of Data Reviewed Labs: ordered.  Risk OTC drugs. Prescription drug management.   This patient presents to the ED for concern of dysuria, nasal congestion, this involves an extensive number of treatment options, and is a complaint that carries with it a high risk of complications and morbidity.  The differential diagnosis includes COVID, flu, RSV, pneumonia, sinusitis, UTI, pyelonephritis, nephrolithiasis, gonorrhea, chlamydia, HIV, syphilis,   Co morbidities that complicate the patient evaluation  See HPI   Additional history obtained:  Additional history obtained from EMR External records from outside source obtained and reviewed including hospital records   Lab Tests:  I Ordered, and personally interpreted labs.  The pertinent results include: GC/chlamydia pending.  UA with bacteria present, leukocytes present.  No pregnancy negative.   Imaging Studies ordered:  N/a   Cardiac Monitoring: / EKG:  N/a   Consultations Obtained:  N/a   Problem List / ED Course / Critical interventions / Medication management  Dysuria, screening for STI, nasal congestion Reevaluation of the patient showed that the patient stayed the same I have reviewed the patients home medicines and have made adjustments as needed   Social Determinants of Health:  Cigarette use.  Denies illicit drug use.   Test / Admission - Considered:  Dysuria, screening for STI, nasal congestion Vitals signs significant for htn. Otherwise within normal range and stable throughout visit. Laboratory studies significant for: see above 43 year old female presents emergency department with request of STI  testing.  Also states that she has had nasal congestion for about a month.  States she has intermittently felt like her ears have at pressure.  Has tried Afrin nasal spray as well as nasal saline with some improvement of symptoms.  Denies any fevers, chills, sinus pressure, sore throat, chest pain, shortness of breath.  Denies any abdominal pain, vaginal itching/discharge.  Does report some feelings of dysuria over the past couple of days.  Denies any known STD exposure. On exam, hospitalization bilaterally.  Abdomen nontender.  Nasal congestion/rhinorrhea present.  UA concerning for infection with leukocyte, bacteria positive; will treat empirically.  Regarding patient's nasal congestion, some concern for  rhinitis medicamentosa says she has been using Afrin for prolonged periods of time.  Will have patient switch to Nasacort.  Will also recommend and has been.  Patient to follow STI testing on MyChart.  Will refrain from empiric treatment at this time given the patient is not symptomatic and with no known exposure.  Treatment plan discussed with patient and she acknowledged understanding was agreeable to said plan.  Patient will well-appearing, afebrile in no acute distress. Worrisome signs and symptoms were discussed with the patient, and the patient acknowledged understanding to return to the ED if noticed. Patient was stable upon discharge.          Final Clinical Impression(s) / ED Diagnoses Final diagnoses:  None    Rx / DC Orders ED Discharge Orders     None          [place], [place] 12/22/23 1637    Mordecai Applebaum, MD 12/22/23 [phone]

## 2023-12-22 NOTE — ED Triage Notes (Addendum)
 Pt is here to have some STI testing along with a pregnancy test. She also would like to have her sinuses checked due to her feeling like her ears are full. Denies discharge and odor.

## 2023-12-23 ENCOUNTER — Ambulatory Visit (HOSPITAL_COMMUNITY)

## 2023-12-24 LAB — GC/CHLAMYDIA PROBE AMP (~~LOC~~) NOT AT ARMC
Chlamydia: NEGATIVE
Comment: NEGATIVE
Comment: NORMAL
Neisseria Gonorrhea: NEGATIVE
